# Patient Record
Sex: Male | Born: 1990 | Race: White | Hispanic: No | Marital: Married | Smoking: Never smoker
Health system: Southern US, Community
[De-identification: ages and names within clinical notes are randomized; demographics above are authoritative.]

## PROBLEM LIST (undated history)

## (undated) DIAGNOSIS — K219 Gastro-esophageal reflux disease without esophagitis: Secondary | ICD-10-CM

## (undated) DIAGNOSIS — F419 Anxiety disorder, unspecified: Secondary | ICD-10-CM

## (undated) DIAGNOSIS — R142 Eructation: Secondary | ICD-10-CM

## (undated) DIAGNOSIS — R1310 Dysphagia, unspecified: Secondary | ICD-10-CM

## (undated) HISTORY — PX: SHOULDER ARTHROSCOPY: SHX128

## (undated) HISTORY — DX: Anxiety disorder, unspecified: F41.9

## (undated) HISTORY — DX: Eructation: R14.2

## (undated) HISTORY — DX: Dysphagia, unspecified: R13.10

## (undated) HISTORY — PX: COLONOSCOPY: SHX174

## (undated) HISTORY — PX: UPPER GASTROINTESTINAL ENDOSCOPY: SHX188

## (undated) HISTORY — DX: Gastro-esophageal reflux disease without esophagitis: K21.9

---
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
## 2014-08-22 HISTORY — PX: WISDOM TOOTH EXTRACTION: SHX21

## 2016-08-30 ENCOUNTER — Encounter (HOSPITAL_COMMUNITY): Payer: Self-pay | Admitting: Emergency Medicine

## 2016-08-30 ENCOUNTER — Emergency Department (HOSPITAL_COMMUNITY)
Admission: EM | Admit: 2016-08-30 | Discharge: 2016-08-30 | Disposition: A | Discharge status: Home / Self Care | Payer: BLUE CROSS/BLUE SHIELD | Attending: Emergency Medicine | Admitting: Emergency Medicine

## 2016-08-30 DIAGNOSIS — Z79899 Other long term (current) drug therapy: Secondary | ICD-10-CM | POA: Insufficient documentation

## 2016-08-30 DIAGNOSIS — R197 Diarrhea, unspecified: Secondary | ICD-10-CM | POA: Insufficient documentation

## 2016-08-30 DIAGNOSIS — R112 Nausea with vomiting, unspecified: Secondary | ICD-10-CM | POA: Diagnosis not present

## 2016-08-30 MED ORDER — ONDANSETRON 8 MG PO TBDP
ORAL_TABLET | ORAL | Status: AC
  Filled 2016-08-30: qty 1

## 2016-08-30 MED ORDER — ONDANSETRON 4 MG PO TBDP
4.0000 mg | ORAL_TABLET | Freq: Once | ORAL | Status: AC
  Administered 2016-08-30: 4 mg via ORAL
  Filled 2016-08-30: qty 1

## 2016-08-30 MED ORDER — ONDANSETRON 8 MG PO TBDP
8.0000 mg | ORAL_TABLET | Freq: Once | ORAL | Status: AC
  Administered 2016-08-30: 8 mg via ORAL

## 2016-08-30 MED ORDER — SODIUM CHLORIDE 0.9 % IV BOLUS (SEPSIS)
1000.0000 mL | Freq: Once | INTRAVENOUS | Status: AC
  Administered 2016-08-30: 1000 mL via INTRAVENOUS

## 2016-08-30 MED ORDER — METOCLOPRAMIDE HCL 5 MG/ML IJ SOLN
10.0000 mg | Freq: Once | INTRAMUSCULAR | Status: AC
  Administered 2016-08-30: 10 mg via INTRAVENOUS
  Filled 2016-08-30: qty 2

## 2016-08-30 MED ORDER — ONDANSETRON 4 MG PO TBDP: 4.0000 mg | ORAL_TABLET | Freq: Three times a day (TID) | ORAL | 0 refills | Status: DC | PRN

## 2016-08-30 NOTE — ED Provider Notes (Signed)
[facility]-EMERGENCY DEPT Provider Note   CSN: [id number] Arrival date & time: 08/30/16  1731     History   Chief Complaint Chief Complaint  Patient presents with  . Emesis  . Diarrhea    HPI Marguarite ArbourCasey T Tripp is a 26 y.o. male.  HPI  The patient is a 26 year old male, he hasa past medical history but does report that he has been exposed to a couple of family members have been vomiting for the last couple of days. He reports the last bnight, started vomiting and has had both vomiting and watery diarrhea, multiple episodes including over 15 episodes of watery nonbloody stools he denies any abdominal pain but does feel like he has a low-grade fever. he denies any abdominal pain at this time.  The symptoms are persistent, moderate, nothing makes this better or worse    History reviewed. No pertinent past medical history.  There are no active problems to display for this patient.   History reviewed. No pertinent surgical history.     Home Medications    Prior to Admission medications   Medication Sig Start Date End Date Taking? Authorizing Provider  fexofenadine (ALLEGRA) 180 MG tablet Take 180 mg by mouth daily.   Yes Historical Provider, MD  fluticasone (FLONASE) 50 MCG/ACT nasal spray Place into the nose.   Yes Historical Provider, MD  ondansetron (ZOFRAN ODT) 4 MG disintegrating tablet Take 1 tablet (4 mg total) by mouth every 8 (eight) hours as needed for nausea. 08/30/16   Eber HongBrian Kaileia Flow, MD    Family History History reviewed. No pertinent family history.  Social History Social History  Substance Use Topics  . Smoking status: Never Smoker  . Smokeless tobacco: Current User    Types: Chew  . Alcohol use Yes     Comment: occasionally     Allergies   Penicillins   Review of Systems Review of Systems  All other systems reviewed and are negative.    Physical Exam Updated Vital Signs BP 120/61 (BP Location: Right Arm)   Pulse 62   Temp 98 F (36.7 C) (Oral)    Resp 16   Ht 5\' 11"  (1.803 m)   Wt 185 lb (83.9 kg)   SpO2 98%   BMI 25.80 kg/m   Physical Exam  Constitutional: He appears well-developed and well-nourished. No distress.  HENT:  Head: Normocephalic and atraumatic.  Mouth/Throat: Oropharynx is clear and moist. No oropharyngeal exudate.  Eyes: Conjunctivae and EOM are normal. Pupils are equal, round, and reactive to light. Right eye exhibits no discharge. Left eye exhibits no discharge. No scleral icterus.  Neck: Normal range of motion. Neck supple. No JVD present. No thyromegaly present.  Cardiovascular: Regular rhythm, normal heart sounds and intact distal pulses.  Exam reveals no gallop and no friction rub.   No murmur heard. Heart rate of 105  Pulmonary/Chest: Effort normal and breath sounds normal. No respiratory distress. He has no wheezes. He has no rales.  Abdominal: Soft. Bowel sounds are normal. He exhibits no distension and no mass. There is no tenderness.  Nontender abdomen  Musculoskeletal: Normal range of motion. He exhibits no edema or tenderness.  Lymphadenopathy:    He has no cervical adenopathy.  Neurological: He is alert. Coordination normal.  Skin: Skin is warm and dry. No rash noted. No erythema.  Psychiatric: He has a normal mood and affect. His behavior is normal.  Nursing note and vitals reviewed.    ED Treatments / Results  Labs (all labs  ordered are listed, but only abnormal results are displayed) Labs Reviewed - No data to display  Radiology No results found.  Procedures Procedures (including critical care time)  Medications Ordered in ED Medications  ondansetron (ZOFRAN-ODT) disintegrating tablet 8 mg (8 mg Oral Given 08/30/16 1746)  ondansetron (ZOFRAN-ODT) disintegrating tablet 4 mg (4 mg Oral Given 08/30/16 1937)  sodium chloride 0.9 % bolus 1,000 mL (0 mLs Intravenous Stopped 08/30/16 2105)  metoCLOPramide (REGLAN) injection 10 mg (10 mg Intravenous Given 08/30/16 1955)  sodium chloride 0.9 %  bolus 1,000 mL (1,000 mLs Intravenous New Bag/Given 08/30/16 2114)     Initial Impression / Assessment and Plan / ED Course  I have reviewed the triage vital signs and the nursing notes.  Pertinent labs & imaging results that were available during my care of the patient were reviewed by me and considered in my medical decision making (see chart for details).  Clinical Course     The patient appears dehydrated, he will need IV fluids as well as Zofran, he does not want any other workup and I agree to this time he does not need anything else.   Labs and fluids given, pt tolerated well and is doing very well, Stable for d/c.  Final Clinical Impressions(s) / ED Diagnoses   Final diagnoses:  Nausea and vomiting, intractability of vomiting not specified, unspecified vomiting type    New Prescriptions New Prescriptions   ONDANSETRON (ZOFRAN ODT) 4 MG DISINTEGRATING TABLET    Take 1 tablet (4 mg total) by mouth every 8 (eight) hours as needed for nausea.     Eber Hong, MD 08/30/16 2154

## 2016-08-30 NOTE — ED Triage Notes (Signed)
Patient complaining of generalized body aches, vomiting, and diarrhea since yesterday.

## 2016-08-30 NOTE — ED Notes (Signed)
Pt has consumed 2 cups of water and tolerated well

## 2016-08-30 NOTE — Discharge Instructions (Signed)
Zofran for nausea Drink plenty of fluids Stay away from other people as you are likely contagious

## 2017-05-03 ENCOUNTER — Encounter: Payer: Self-pay | Admitting: Internal Medicine

## 2017-06-26 ENCOUNTER — Encounter (INDEPENDENT_AMBULATORY_CARE_PROVIDER_SITE_OTHER): Payer: Self-pay

## 2017-06-26 ENCOUNTER — Ambulatory Visit (INDEPENDENT_AMBULATORY_CARE_PROVIDER_SITE_OTHER): Payer: BLUE CROSS/BLUE SHIELD | Admitting: Internal Medicine

## 2017-06-26 ENCOUNTER — Encounter: Payer: Self-pay | Admitting: Internal Medicine

## 2017-06-26 ENCOUNTER — Other Ambulatory Visit (INDEPENDENT_AMBULATORY_CARE_PROVIDER_SITE_OTHER): Payer: BLUE CROSS/BLUE SHIELD

## 2017-06-26 VITALS — BP 112/72 | HR 68 | Ht 71.0 in | Wt 204.0 lb

## 2017-06-26 DIAGNOSIS — R197 Diarrhea, unspecified: Secondary | ICD-10-CM

## 2017-06-26 DIAGNOSIS — R1084 Generalized abdominal pain: Secondary | ICD-10-CM

## 2017-06-26 DIAGNOSIS — R1310 Dysphagia, unspecified: Secondary | ICD-10-CM

## 2017-06-26 LAB — IGA: IGA: 298 mg/dL (ref 68–378)

## 2017-06-26 MED ORDER — NA SULFATE-K SULFATE-MG SULF 17.5-3.13-1.6 GM/177ML PO SOLN: 1.0000 | Freq: Once | ORAL | 0 refills | Status: AC

## 2017-06-26 NOTE — Patient Instructions (Signed)
Your physician has requested that you go to the basement for the following lab work before leaving today:  TTG, IGA  You have been scheduled for an endoscopy and colonoscopy. Please follow the written instructions given to you at your visit today. Please pick up your prep supplies at the pharmacy within the next 1-3 days. If you use inhalers (even only as needed), please bring them with you on the day of your procedure. Your physician has requested that you go to www.startemmi.com and enter the access code given to you at your visit today. This web site gives a general overview about your procedure. However, you should still follow specific instructions given to you by our office regarding your preparation for the procedure.   

## 2017-06-26 NOTE — Progress Notes (Signed)
HISTORY OF PRESENT ILLNESS:  Kristopher ArbourCasey T Haack is a 26 y.o. male who is referred by Dr. Maryelizabeth RowanElizabeth Dewey regarding problems with abdominal pain, diarrhea, and dysphagia. He is accompanied by his wife. First, patient reports problems with intermittent solid food and pill dysphagia for his long as he can remember. He was seen by an ENT specialist and told that he has acid reflux. He did undergo laryngoscopy but no x-rays. He was prescribed omeprazole but could not swallow the pill. His difficulties continue. Next, he has at least a one-year history of problems with abdominal cramping, urgency, and loose stools. However, symptoms have been much more prominent over the past 8 months or so. Typically has multiple loose bowel movements per day. He describes a minimum of 5. He does occasionally see blood on the tissue and occasionally has some rectal discomfort. Occasional formed stools are normal colored and sink. He has had no weight loss. He will have occasional nocturnal symptoms. Review of outside stool studies were negative for enteric pathogens and ova and parasites. Negative for white blood cells. Negative for Clostridium difficile. Blood work including CBC and comprehensive metabolic panel and TSH were normal. Hemoglobin 15.3. Normal MCV. Normal albumin at 4.4. He was seen in the emergency room in January. That encounter has been reviewed. At that time he presented with vomiting and watery diarrhea ALT will episodes. Apparently there had been family illness.  REVIEW OF SYSTEMS:  All non-GI ROS negative entirely upon comprehensive review  History reviewed. No pertinent past medical history.  Past Surgical History:  Procedure Laterality Date  . WISDOM TOOTH EXTRACTION  2016    Social History Kristopher Hutchinson  reports that  has never smoked. His smokeless tobacco use includes chew. He reports that he drinks alcohol. He reports that he does not use drugs.  family history includes Heart disease in his maternal  grandfather and paternal grandfather.  Allergies  Allergen Reactions  . Penicillins Anaphylaxis       PHYSICAL EXAMINATION: Vital signs: BP 112/72   Pulse 68   Ht 5\' 11"  (1.803 m)   Wt 204 lb (92.5 kg)   BMI 28.45 kg/m   Constitutional: generally well-appearing, no acute distress Psychiatric: alert and oriented x3, cooperative Eyes: extraocular movements intact, anicteric, conjunctiva pink Mouth: oral pharynx moist, no lesions Neck: supple no lymphadenopathy Cardiovascular: heart regular rate and rhythm, no murmur Lungs: clear to auscultation bilaterally Abdomen: soft, nontender, nondistended, no obvious ascites, no peritoneal signs, normal bowel sounds, no organomegaly Rectal: Deferred until colonoscopy Extremities: no clubbing, cyanosis, or lower extremity edema bilaterally Skin: no lesions on visible extremities Neuro: No focal deficits. Cranial nerves intact  ASSESSMENT:  #1. Severe intermittent solid food and pill dysphagia chronically. 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 Problems with cramping abdominal pain urgency and diarrhea as described. Worse this year. Acute diarrheal illness earlier this year. 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 Rule out celiac. Rule out microscopic colitis.    PLAN:  #1. Schedule upper endoscopy with possible esophageal dilation.The nature of the procedure, as well as the risks, benefits, and alternatives were carefully and thoroughly reviewed with the patient. Ample time for discussion and questions allowed. The patient understood, was satisfied, and agreed to proceed. #2. Schedule colonoscopy with biopsies.The nature of the procedure, as well as the risks, benefits, and alternatives were carefully and thoroughly reviewed with the patient. Ample time for discussion and questions allowed. The patient understood, was satisfied, and agreed to proceed. #  3. Celiac profile to include tissue  transaminase antibody IgA #4. Further recommendations to follow after the above completed.   A copy of this consultation note has been sent to Dr. Duanne Guess

## 2017-06-27 LAB — TISSUE TRANSGLUTAMINASE, IGA: (TTG) AB, IGA: 1 U/mL

## 2017-08-23 ENCOUNTER — Telehealth: Payer: Self-pay | Admitting: Internal Medicine

## 2017-08-23 MED ORDER — SUPREP BOWEL PREP KIT 17.5-3.13-1.6 GM/177ML PO SOLN: 1.0000 | ORAL | 0 refills | Status: DC

## 2017-08-23 NOTE — Telephone Encounter (Signed)
Suprep rx sent to pharmacy.

## 2017-09-04 ENCOUNTER — Other Ambulatory Visit: Payer: Self-pay

## 2017-09-04 ENCOUNTER — Emergency Department (HOSPITAL_COMMUNITY)
Admission: EM | Admit: 2017-09-04 | Discharge: 2017-09-05 | Disposition: A | Discharge status: Home / Self Care | Payer: BLUE CROSS/BLUE SHIELD | Attending: Emergency Medicine | Admitting: Emergency Medicine

## 2017-09-04 DIAGNOSIS — Z79899 Other long term (current) drug therapy: Secondary | ICD-10-CM | POA: Diagnosis not present

## 2017-09-04 DIAGNOSIS — K222 Esophageal obstruction: Secondary | ICD-10-CM | POA: Diagnosis present

## 2017-09-04 DIAGNOSIS — F17290 Nicotine dependence, other tobacco product, uncomplicated: Secondary | ICD-10-CM | POA: Insufficient documentation

## 2017-09-04 DIAGNOSIS — T18128A Food in esophagus causing other injury, initial encounter: Secondary | ICD-10-CM

## 2017-09-04 MED ORDER — GLUCAGON HCL RDNA (DIAGNOSTIC) 1 MG IJ SOLR
1.0000 mg | Freq: Once | INTRAMUSCULAR | Status: AC
  Administered 2017-09-04: 1 mg via INTRAVENOUS
  Filled 2017-09-04: qty 1

## 2017-09-04 MED ORDER — ONDANSETRON HCL 4 MG/2ML IJ SOLN
4.0000 mg | Freq: Once | INTRAMUSCULAR | Status: AC
  Administered 2017-09-04: 4 mg via INTRAVENOUS
  Filled 2017-09-04: qty 2

## 2017-09-04 MED ORDER — NITROGLYCERIN 0.4 MG SL SUBL
0.4000 mg | SUBLINGUAL_TABLET | Freq: Once | SUBLINGUAL | Status: AC
  Administered 2017-09-04: 0.4 mg via SUBLINGUAL
  Filled 2017-09-04: qty 1

## 2017-09-04 NOTE — ED Triage Notes (Signed)
Pt was eating dinner and got chicken stuck in his throat, feels like nothing can go down, pt in no distress and is able to talk, no SOB

## 2017-09-04 NOTE — Discharge Instructions (Signed)
Soft, non-chew diet until follow-up with Dr. Marina GoodellPerry.

## 2017-09-04 NOTE — ED Notes (Signed)
Pt able to swallow water at this time- Dr Fayrene FearingJames aware and at bedside to reevaluate pt

## 2017-09-05 ENCOUNTER — Telehealth: Payer: Self-pay | Admitting: Internal Medicine

## 2017-09-05 ENCOUNTER — Encounter: Payer: Self-pay | Admitting: Internal Medicine

## 2017-09-05 NOTE — Telephone Encounter (Signed)
[facility] Was seen in the ER last night due to choking on some chicken. Almost had emergency EGD but was able to get the chicken down and then drink water. Pts wife is concerned because today he is able to swallow liquids but reports it seems more difficult to do so. Concerned and want to know what they should do in the mean time until procedure. Please advise.

## 2017-09-05 NOTE — Telephone Encounter (Signed)
Pt's wife aware.

## 2017-09-05 NOTE — ED Provider Notes (Signed)
[facility] EMERGENCY DEPARTMENT Provider Note   CSN: [id number] Arrival date & time: 09/04/17  2117     History   Chief Complaint Chief Complaint  Patient presents with  . food bolus    chicken stuck in throat    HPI Kristopher Hutchinson is a 27 y.o. male.  Complaint is "food stuck"  HPI 27 year old male with no previous formal diagnosis of esophageal disorders.  However, previous episodes of a feeling of food impactions that have resolved with emesis or spontaneously.  At 70 tonight he was eating chicken he felt like it suddenly became lodged in his upper esophagus.  He points to an area just above the sternal notch.  He is spitting his secretions and anxious and presents here.  No past medical history on file.  There are no active problems to display for this patient.   Past Surgical History:  Procedure Laterality Date  . WISDOM TOOTH EXTRACTION  2016       Home Medications    Prior to Admission medications   Medication Sig Start Date End Date Taking? Authorizing Provider  fexofenadine (ALLEGRA) 180 MG tablet Take 180 mg by mouth daily.   Yes [provider]  fluticasone (FLONASE) 50 MCG/ACT nasal spray Place into the nose.   Yes [provider]  SUPREP BOWEL PREP KIT 17.5-3.13-1.6 GM/177ML SOLN Take 1 kit by mouth as directed. 08/23/17  Yes Irene Shipper, MD    Family History Family History  Problem Relation Age of Onset  . Heart disease Maternal Grandfather   . Heart disease Paternal Grandfather     Social History Social History   Tobacco Use  . Smoking status: Never Smoker  . Smokeless tobacco: Current User    Types: Chew  Substance Use Topics  . Alcohol use: Yes    Comment: occasionally  . Drug use: No     Allergies   Penicillins   Review of Systems Review of Systems  Constitutional: Negative for appetite change, chills, diaphoresis, fatigue and fever.  HENT: Positive for drooling. Negative for mouth sores, sore throat and  trouble swallowing.   Eyes: Negative for visual disturbance.  Respiratory: Positive for choking. Negative for cough, chest tightness, shortness of breath and wheezing.   Cardiovascular: Negative for chest pain.  Gastrointestinal: Negative for abdominal distention, abdominal pain, diarrhea, nausea and vomiting.  Endocrine: Negative for polydipsia, polyphagia and polyuria.  Genitourinary: Negative for dysuria, frequency and hematuria.  Musculoskeletal: Negative for gait problem.  Skin: Negative for color change, pallor and rash.  Neurological: Negative for dizziness, syncope, light-headedness and headaches.  Hematological: Does not bruise/bleed easily.  Psychiatric/Behavioral: Negative for behavioral problems and confusion.     Physical Exam Updated Vital Signs BP (!) 129/91   Pulse 81   Temp 98.5 F (36.9 C) (Oral)   Resp 17   Ht _0  (1.778 m)   Wt 86.2 kg (190 lb)   SpO2 97%   BMI 27.26 kg/m   Physical Exam  Constitutional: He is oriented to person, place, and time. He appears well-developed and well-nourished. No distress.  HENT:  Head: Normocephalic.  Pleasant.  Anxious.  Spitting secretions.  No difficulty breathing.  Well oxygenated  Eyes: Conjunctivae are normal. Pupils are equal, round, and reactive to light. No scleral icterus.  Neck: Normal range of motion. Neck supple. No thyromegaly present.  Cardiovascular: Normal rate and regular rhythm. Exam reveals no gallop and no friction rub.  No murmur heard. Pulmonary/Chest: Effort normal and breath  sounds normal. No respiratory distress. He has no wheezes. He has no rales.  Abdominal: Soft. Bowel sounds are normal. He exhibits no distension. There is no tenderness. There is no rebound.  Musculoskeletal: Normal range of motion.  Neurological: He is alert and oriented to person, place, and time.  Skin: Skin is warm and dry. No rash noted.  Psychiatric: He has a normal mood and affect. His behavior is normal.     ED  Treatments / Results  Labs (all labs ordered are listed, but only abnormal results are displayed) Labs Reviewed - No data to display  EKG  EKG Interpretation None       Radiology No results found.  Procedures Procedures (including critical care time)  Medications Ordered in ED Medications  ondansetron (ZOFRAN) injection 4 mg (4 mg Intravenous Given 09/04/17 2249)  glucagon (human recombinant) (GLUCAGEN) injection 1 mg (1 mg Intravenous Given 09/04/17 2250)  nitroGLYCERIN (NITROSTAT) SL tablet 0.4 mg (0.4 mg Sublingual Given 09/04/17 2249)     Initial Impression / Assessment and Plan / ED Course  I have reviewed the triage vital signs and the nursing notes.  Pertinent labs & imaging results that were available during my care of the patient were reviewed by me and considered in my medical decision making (see chart for details).     IV placed.  Given glucagon 1 mg IV, Zofran 4 mg IV.  Given 1 sublingual nitroglycerin.  After 30 minutes he had no improvement.  I placed a call to Dr. Buford Dresser.  Dr. Elta Guadeloupe was mobilizing the endoscopy team for planned extraction.  However, just after this the patient called out and had resolution.  Is able to watch him drink 2 full glasses of water without difficulty or regurgitation or drooling.  He is discharged home.  He has a follow-up appointment with Dr. Henrene Pastor, gastroneurologist in [place].  Plan is soft, no chew diet until follow-up with Dr. Henrene Pastor.  Final Clinical Impressions(s) / ED Diagnoses   Final diagnoses:  Esophageal obstruction due to food impaction    ED Discharge Orders    None       Tanna Furry, MD 09/05/17 [phone]

## 2017-09-05 NOTE — Telephone Encounter (Signed)
Stay away from meats until his procedures are completed. Only soft foods. Chew very well. Small portions at a time. Keep endoscopy appointment as scheduled

## 2017-09-11 ENCOUNTER — Telehealth: Payer: Self-pay | Admitting: Internal Medicine

## 2017-09-11 NOTE — Telephone Encounter (Signed)
Not a good idea as he will be receiving sedation that day. This will take care of his anxiety

## 2017-09-11 NOTE — Telephone Encounter (Signed)
Left message for pt to call back.  Wife states that pt is very nervous and anxious about upcoming procedures, he has never been sedated and is having lots of anxiety. Asking if there is anything pt can take that morning prior to the procedure to help with this. Please advise.

## 2017-09-11 NOTE — Telephone Encounter (Signed)
Spoke with pts wife and she is aware. 

## 2017-09-13 ENCOUNTER — Ambulatory Visit (AMBULATORY_SURGERY_CENTER): Payer: BLUE CROSS/BLUE SHIELD | Admitting: Internal Medicine

## 2017-09-13 ENCOUNTER — Other Ambulatory Visit: Payer: Self-pay

## 2017-09-13 ENCOUNTER — Encounter: Payer: Self-pay | Admitting: Internal Medicine

## 2017-09-13 ENCOUNTER — Telehealth: Payer: Self-pay | Admitting: Internal Medicine

## 2017-09-13 VITALS — BP 124/93 | HR 67 | Temp 98.0°F | Resp 12 | Ht 71.0 in | Wt 204.0 lb

## 2017-09-13 DIAGNOSIS — R197 Diarrhea, unspecified: Secondary | ICD-10-CM

## 2017-09-13 DIAGNOSIS — K222 Esophageal obstruction: Secondary | ICD-10-CM

## 2017-09-13 DIAGNOSIS — R1084 Generalized abdominal pain: Secondary | ICD-10-CM | POA: Diagnosis not present

## 2017-09-13 DIAGNOSIS — R1310 Dysphagia, unspecified: Secondary | ICD-10-CM

## 2017-09-13 MED ORDER — SODIUM CHLORIDE 0.9 % IV SOLN: 500.0000 mL | Freq: Once | INTRAVENOUS | Status: DC

## 2017-09-13 MED ORDER — HYDROCODONE-ACETAMINOPHEN 5-325 MG PO TABS: 1.0000 | ORAL_TABLET | ORAL | 0 refills | Status: DC | PRN

## 2017-09-13 NOTE — Telephone Encounter (Signed)
Reexamined the esophagus post EGD dilation. Superficial trauma in several areas. Pain expected. Looked good postprocedure wide-awake. Modify diet and provide Vicodin for symptomatic relief. [facility]. Thank you

## 2017-09-13 NOTE — Telephone Encounter (Signed)
Per Dr. Lamar SprinklesPerry's order, an Rx for Vicodin 5/325 #10 was printed and left at 3rd floor front desk for patient's wife to pick up.  Spoke with patient's wife and she will come pick up today.  Advised patient to stay on clear liquids and progress to soft diet as tolerated.  They will call back if symptoms worsen.

## 2017-09-13 NOTE — Telephone Encounter (Signed)
Patient wife calling back regarding this stating that patient is in severe pain.

## 2017-09-13 NOTE — Telephone Encounter (Signed)
Returned patient's call and spoke with his wife.  She states that patient is having sharp pain on the right side of his throat when drinking liquids.  He is able to get sips down but it is very painful.  She is concerned about him getting dehydrated.  Spoke with Dr. Marina GoodellPerry.  He will call patient to speak with them about their concerns.

## 2017-09-13 NOTE — Progress Notes (Signed)
PT C/O 8/10 CENTER CHEST DISCOMFORT . Dr Marina Goodellperry made aware.

## 2017-09-13 NOTE — Progress Notes (Signed)
To recovery, report to RN, VSS. 

## 2017-09-13 NOTE — Progress Notes (Signed)
Pt's states no medical or surgical changes since previsit or office visit. 

## 2017-09-13 NOTE — Patient Instructions (Addendum)
   FOLLOW DILATATION DIET GIVEN TO YOU TODAY    YOU HAD AN ENDOSCOPIC PROCEDURE TODAY AT THE [facility]:   Refer to the procedure report that was given to you for any specific questions about what was found during the examination.  If the procedure report does not answer your questions, please call your gastroenterologist to clarify.  If you requested that your care partner not be given the details of your procedure findings, then the procedure report has been included in a sealed envelope for you to review at your convenience later.  YOU SHOULD EXPECT: Some feelings of bloating in the abdomen. Passage of more gas than usual.  Walking can help get rid of the air that was put into your GI tract during the procedure and reduce the bloating. If you had a lower endoscopy (such as a colonoscopy or flexible sigmoidoscopy) you may notice spotting of blood in your stool or on the toilet paper. If you underwent a bowel prep for your procedure, you may not have a normal bowel movement for a few days.  Please Note:  You might notice some irritation and congestion in your nose or some drainage.  This is from the oxygen used during your procedure.  There is no need for concern and it should clear up in a day or so.  SYMPTOMS TO REPORT IMMEDIATELY:   Following lower endoscopy (colonoscopy or flexible sigmoidoscopy):  Excessive amounts of blood in the stool  Significant tenderness or worsening of abdominal pains  Swelling of the abdomen that is new, acute  Fever of 100F or higher   Following upper endoscopy (EGD)  Vomiting of blood or coffee ground material  New chest pain or pain under the shoulder blades  Painful or persistently difficult swallowing  New shortness of breath  Fever of 100F or higher  Black, tarry-looking stools  For urgent or emergent issues, a gastroenterologist can be reached at any hour by calling [phone].   DIET:  FOLLOW DILATATION DIET GIVEN TO YOU  TODAY   ACTIVITY:  You should plan to take it easy for the rest of today and you should NOT DRIVE or use heavy machinery until tomorrow (because of the sedation medicines used during the test).    FOLLOW UP: Our staff will call the number listed on your records the next business day following your procedure to check on you and address any questions or concerns that you may have regarding the information given to you following your procedure. If we do not reach you, we will leave a message.  However, if you are feeling well and you are not experiencing any problems, there is no need to return our call.  We will assume that you have returned to your regular daily activities without incident.  If any biopsies were taken you will be contacted by phone or by letter within the next 1-3 weeks.  Please call us at [phone] if you have not heard about the biopsies in 3 weeks.    SIGNATURES/CONFIDENTIALITY: You and/or your care partner have signed paperwork which will be entered into your electronic medical record.  These signatures attest to the fact that that the information above on your After Visit Summary has been reviewed and is understood.  Full responsibility of the confidentiality of this discharge information lies with you and/or your care-partner.

## 2017-09-13 NOTE — Op Note (Signed)
[facility] Patient Name: Kristopher Hutchinson Procedure Date: 09/13/2017 11:00 AM MRN: [id number] Endoscopist: Wilhemina Bonito. Marina Goodell , MD Age: 27 Referring MD:  Date of Birth: 02/20/1991 Gender: Male Account #: [id number] Procedure:                Upper GI endoscopy, with [facility] dilation of the                            esophagus-70F Indications:              Dysphagia. Recurrent transient food impactions Medicines:                Monitored Anesthesia Care Procedure:                Pre-Anesthesia Assessment:                           - Prior to the procedure, a History and Physical                            was performed, and patient medications and                            allergies were reviewed. The patient's tolerance of                            previous anesthesia was also reviewed. The risks                            and benefits of the procedure and the sedation                            options and risks were discussed with the patient.                            All questions were answered, and informed consent                            was obtained. Prior Anticoagulants: The patient has                            taken no previous anticoagulant or antiplatelet                            agents. ASA Grade Assessment: I - A normal, healthy                            patient. After reviewing the risks and benefits,                            the patient was deemed in satisfactory condition to                            undergo the procedure.  After obtaining informed consent, the endoscope was                            passed under direct vision. Throughout the                            procedure, the patient's blood pressure, pulse, and                            oxygen saturations were monitored continuously. The                            Model GIF-HQ190 [id number]) scope was introduced                            through the mouth, and  advanced to the second part                            of duodenum. The upper GI endoscopy was                            accomplished without difficulty. The patient                            tolerated the procedure well. Scope In: Scope Out: Findings:                 Multiple moderate benign-appearing, intrinsic                            stenoses -rings and furrows. See images. The                            narrowest stenosis measured 1 cm (inner diameter).                            Several were disrupted with passage of the                            endoscope. The scope was withdrawn after the                            endoscopic survey was completed. Dilation was                            performed with a Maloney dilator with minimal                            resistance at 46 Fr. The dilation site was examined                            following endoscope reinsertion and showed classic                            linear lacerations  associated with ring disruption.                            There was definite moderate improvement in luminal                            narrowing.                           The stomach was normal.                           The examined duodenum was normal.                           The cardia and gastric fundus were normal on                            retroflexion. Complications:            No immediate complications. Estimated Blood Loss:     Estimated blood loss: none. Impression:               - Multiple Benign-appearing esophageal stenoses.                            EOE appearance. Dilated.                           - Normal stomach.                           - Normal examined duodenum.                           - No specimens collected. Recommendation:           - Patient has a contact number available for                            emergencies. The signs and symptoms of potential                            delayed complications were  discussed with the                            patient. Return to normal activities tomorrow.                            Written discharge instructions were provided to the                            patient.                           - Post-dilation diet. Continue to chew food well.                           - Prescribe omeprazole 40  mg; #60; one by mouth                            twice a day; 11 refills                           - Schedule repeat EGD with dilation in the [facility] in                            4-6 weeks. Wilhemina BonitoJohn N. Marina GoodellPerry, MD 09/13/2017 11:39:15 AM This report has been signed electronically.

## 2017-09-13 NOTE — Progress Notes (Signed)
Called to room to assist during endoscopic procedure.  Patient ID and intended procedure confirmed with present staff. Received instructions for my participation in the procedure from the performing physician.  

## 2017-09-13 NOTE — Op Note (Signed)
[facility] Patient Name: Kristopher Hutchinson Procedure Date: 09/13/2017 11:00 AM MRN: [id number] Endoscopist: Wilhemina Bonito. Marina Goodell , MD Age: 27 Referring MD:  Date of Birth: 11-28-1990 Gender: Male Account #: [id number] Procedure:                Colonoscopy, with biopsies Indications:              Abdominal pain, Chronic diarrhea Medicines:                Monitored Anesthesia Care Procedure:                Pre-Anesthesia Assessment:                           - Prior to the procedure, a History and Physical                            was performed, and patient medications and                            allergies were reviewed. The patient's tolerance of                            previous anesthesia was also reviewed. The risks                            and benefits of the procedure and the sedation                            options and risks were discussed with the patient.                            All questions were answered, and informed consent                            was obtained. Prior Anticoagulants: The patient has                            taken no previous anticoagulant or antiplatelet                            agents. ASA Grade Assessment: I - A normal, healthy                            patient. After reviewing the risks and benefits,                            the patient was deemed in satisfactory condition to                            undergo the procedure.                           After obtaining informed consent, the colonoscope  was passed under direct vision. Throughout the                            procedure, the patient's blood pressure, pulse, and                            oxygen saturations were monitored continuously. The                            Colonoscope was introduced through the anus and                            advanced to the the cecum, identified by                            appendiceal orifice and ileocecal  valve. The                            terminal ileum, ileocecal valve, appendiceal                            orifice, and rectum were photographed. The quality                            of the bowel preparation was excellent. The                            colonoscopy was performed without difficulty. The                            patient tolerated the procedure well. The bowel                            preparation used was SUPREP. Scope In: 11:10:05 AM Scope Out: 11:19:38 AM Scope Withdrawal Time: 0 hours 8 minutes 1 second  Total Procedure Duration: 0 hours 9 minutes 33 seconds  Findings:                 The terminal ileum appeared normal for 15 cm.                           The entire examined colon appeared normal on direct                            and retroflexion views. Biopsies for histology were                            taken with a cold forceps from the entire colon for                            evaluation of microscopic colitis. Complications:            No immediate complications. Estimated blood loss:  None. Estimated Blood Loss:     Estimated blood loss: none. Impression:               - The examined portion of the ileum was normal.                           - The entire examined colon is normal on direct and                            retroflexion views. Recommendation:           - EGD today. Please see report.                           - Patient has a contact number available for                            emergencies. The signs and symptoms of potential                            delayed complications were discussed with the                            patient. Return to normal activities tomorrow.                            Written discharge instructions were provided to the                            patient.                           - Resume previous diet.                           - Continue present medications.                            - Await pathology results. Wilhemina Bonito. Marina Goodell, MD 09/13/2017 11:23:40 AM This report has been signed electronically.

## 2017-09-13 NOTE — Telephone Encounter (Addendum)
Returned call from pts wife at 1415pm.  Pt is complaining of shooting pain and rates it 8 or 9 out of 10. He has only been able to take a few sips d/t the pain. His pain is mid chest and is radiating to his right arm.  Dr. Marina GoodellPerry please advise.

## 2017-09-14 ENCOUNTER — Telehealth: Payer: Self-pay | Admitting: *Deleted

## 2017-09-14 ENCOUNTER — Other Ambulatory Visit: Payer: Self-pay | Admitting: *Deleted

## 2017-09-14 ENCOUNTER — Telehealth: Payer: Self-pay

## 2017-09-14 DIAGNOSIS — R1310 Dysphagia, unspecified: Secondary | ICD-10-CM

## 2017-09-14 MED ORDER — OMEPRAZOLE 40 MG PO CPDR: 40.0000 mg | DELAYED_RELEASE_CAPSULE | Freq: Two times a day (BID) | ORAL | 1 refills | Status: DC

## 2017-09-14 MED ORDER — OMEPRAZOLE 40 MG PO CPDR: 40.0000 mg | DELAYED_RELEASE_CAPSULE | Freq: Two times a day (BID) | ORAL | 11 refills | Status: DC

## 2017-09-14 NOTE — Telephone Encounter (Signed)
  Follow up Call-  Call back number 09/13/2017  Post procedure Call Back phone  # [phone]  Permission to leave phone message Yes  Some recent data might be hidden     Patient questions:  Do you have a fever, pain , or abdominal swelling? Yes.   Pain Score  5 *  Have you tolerated food without any problems? No.  Have you been able to return to your normal activities? No.  Do you have any questions about your discharge instructions: Diet   No. Medications  No. Follow up visit  No.  Do you have questions or concerns about your Care? No.  Actions: * If pain score is 4 or above: No action needed, pain <4.  Patient is still experiencing pain when he drinks per wife. Dr. Marina GoodellPerry said he would be really sore from the dilation. Told wife to call back when he wakes up if it is still really sore when he drinks.

## 2017-09-14 NOTE — Telephone Encounter (Signed)
Omeprazole sent into pharmacy per D.O. And EGD and PV appts made for pt

## 2017-09-14 NOTE — Telephone Encounter (Signed)
I spoke with patient's wife yesterday afternoon. He is experiencing pain with swallowing. No chest pain. No pain with inspiration or breathing difficulty. Uncomfortable but in no acute distress. Expected based on diagnosis, treatment, and post-dilation endoscopic findings. They have picked up the prescription for analgesics. Directions given regarding advancement of diet and pain management. Endoscopy nurse to follow-up in a.m. Contact us in the interim for worrisome signs or symptoms should they occur. She was appreciative

## 2017-09-14 NOTE — Telephone Encounter (Signed)
Called pt's wife TurkeyVictoria back @ [phone] left message that Omeprazole RX was sent in to [facility] in [place] and gave her dates and times for EGD scheduled for 10/13/17 and a previsit scheduled 2/15 19.

## 2017-09-15 ENCOUNTER — Telehealth: Payer: Self-pay

## 2017-09-15 NOTE — Telephone Encounter (Signed)
Called Patients wife, Kristopher Hutchinson, and followed up to make sure she received Pennys message on 09/14/17. She did get the message about his medication and his 2 appointments. She also said that he was able to eat something yesterday and that the soreness is improving in his throat.

## 2017-09-18 ENCOUNTER — Telehealth: Payer: Self-pay | Admitting: Internal Medicine

## 2017-09-18 ENCOUNTER — Other Ambulatory Visit: Payer: Self-pay

## 2017-09-18 DIAGNOSIS — R1013 Epigastric pain: Secondary | ICD-10-CM

## 2017-09-18 NOTE — Telephone Encounter (Signed)
Spoke with pts wife and she is aware. Pt scheduled for gast[facility]rafin swallow at [facility] 09/19/17@8 :30am, pt to arrive there at 8:15am. Pt to be NPO after midnight. Wife aware of appt.

## 2017-09-18 NOTE — Telephone Encounter (Signed)
If he is gradually improving, then I think it is okay. However, since there are ongoing complaints and concerns set him up for a Gastrografin swallow "esophageal dilation last week, chest pain with eating ". Thanks

## 2017-09-18 NOTE — Telephone Encounter (Signed)
Left message for pt to call back  °

## 2017-09-18 NOTE — Telephone Encounter (Signed)
Pts wife states that the pt is still c/o pain in his chest when he swallows any food or liquid. States it feels like a knife in the right side of his chest right under his right breast. Wife concerned that he is still having discomfort and wants to make sure this is to be expected and nothing to worry about. Please advise.

## 2017-09-19 ENCOUNTER — Encounter: Payer: Self-pay | Admitting: Internal Medicine

## 2017-09-19 ENCOUNTER — Ambulatory Visit (HOSPITAL_COMMUNITY)
Admission: RE | Admit: 2017-09-19 | Discharge: 2017-09-19 | Disposition: A | Discharge status: Home / Self Care | Payer: BLUE CROSS/BLUE SHIELD | Source: Ambulatory Visit | Attending: Internal Medicine | Admitting: Internal Medicine

## 2017-09-19 ENCOUNTER — Ambulatory Visit (HOSPITAL_COMMUNITY): Payer: BLUE CROSS/BLUE SHIELD

## 2017-09-19 DIAGNOSIS — K222 Esophageal obstruction: Secondary | ICD-10-CM | POA: Insufficient documentation

## 2017-09-19 DIAGNOSIS — R0789 Other chest pain: Secondary | ICD-10-CM | POA: Insufficient documentation

## 2017-09-19 DIAGNOSIS — R1013 Epigastric pain: Secondary | ICD-10-CM | POA: Insufficient documentation

## 2017-09-19 MED ORDER — IOPAMIDOL (ISOVUE-300) INJECTION 61%
INTRAVENOUS | Status: AC
  Administered 2017-09-19: 50 mL via ORAL
  Filled 2017-09-19: qty 150

## 2017-09-19 MED ORDER — IOPAMIDOL (ISOVUE-300) INJECTION 61%
150.0000 mL | Freq: Once | INTRAVENOUS | Status: AC | PRN
  Administered 2017-09-19: 50 mL via ORAL

## 2017-10-06 ENCOUNTER — Ambulatory Visit (AMBULATORY_SURGERY_CENTER): Payer: Self-pay

## 2017-10-06 VITALS — Ht 71.0 in | Wt 204.6 lb

## 2017-10-06 DIAGNOSIS — R1310 Dysphagia, unspecified: Secondary | ICD-10-CM

## 2017-10-06 NOTE — Progress Notes (Signed)
Per pt, no allergies to soy or egg products.Pt not taking any weight loss meds or using  O2 at home.  Pt refused emmi video. 

## 2017-10-13 ENCOUNTER — Ambulatory Visit (AMBULATORY_SURGERY_CENTER): Payer: BLUE CROSS/BLUE SHIELD | Admitting: Internal Medicine

## 2017-10-13 ENCOUNTER — Encounter: Payer: Self-pay | Admitting: Internal Medicine

## 2017-10-13 ENCOUNTER — Other Ambulatory Visit: Payer: Self-pay

## 2017-10-13 VITALS — BP 129/86 | HR 66 | Temp 98.7°F | Resp 11 | Ht 71.0 in | Wt 204.0 lb

## 2017-10-13 DIAGNOSIS — R1310 Dysphagia, unspecified: Secondary | ICD-10-CM

## 2017-10-13 DIAGNOSIS — K222 Esophageal obstruction: Secondary | ICD-10-CM

## 2017-10-13 MED ORDER — HYDROCODONE-ACETAMINOPHEN 5-325 MG PO TABS: ORAL_TABLET | ORAL | 0 refills | Status: DC

## 2017-10-13 MED ORDER — SODIUM CHLORIDE 0.9 % IV SOLN: 500.0000 mL | Freq: Once | INTRAVENOUS | Status: DC

## 2017-10-13 NOTE — Patient Instructions (Signed)
Post dilatation  diet given  YOU HAD AN ENDOSCOPIC PROCEDURE TODAY AT THE [facility]:   Refer to the procedure report that was given to you for any specific questions about what was found during the examination.  If the procedure report does not answer your questions, please call your gastroenterologist to clarify.  If you requested that your care partner not be given the details of your procedure findings, then the procedure report has been included in a sealed envelope for you to review at your convenience later.  YOU SHOULD EXPECT: Some feelings of bloating in the abdomen. Passage of more gas than usual.  Walking can help get rid of the air that was put into your GI tract during the procedure and reduce the bloating. If you had a lower endoscopy (such as a colonoscopy or flexible sigmoidoscopy) you may notice spotting of blood in your stool or on the toilet paper. If you underwent a bowel prep for your procedure, you may not have a normal bowel movement for a few days.  Please Note:  You might notice some irritation and congestion in your nose or some drainage.  This is from the oxygen used during your procedure.  There is no need for concern and it should clear up in a day or so.  SYMPTOMS TO REPORT IMMEDIATELY:     Following upper endoscopy (EGD)  Vomiting of blood or coffee ground material  New chest pain or pain under the shoulder blades  Painful or persistently difficult swallowing  New shortness of breath  Fever of 100F or higher  Black, tarry-looking stools  For urgent or emergent issues, a gastroenterologist can be reached at any hour by calling [phone].   DIET:  We do recommend a small meal at first, but then you may proceed to your regular diet.  Drink plenty of fluids but you should avoid alcoholic beverages for 24 hours.  ACTIVITY:  You should plan to take it easy for the rest of today and you should NOT DRIVE or use heavy machinery until tomorrow  (because of the sedation medicines used during the test).    FOLLOW UP: Our staff will call the number listed on your records the next business day following your procedure to check on you and address any questions or concerns that you may have regarding the information given to you following your procedure. If we do not reach you, we will leave a message.  However, if you are feeling well and you are not experiencing any problems, there is no need to return our call.  We will assume that you have returned to your regular daily activities without incident.  If any biopsies were taken you will be contacted by phone or by letter within the next 1-3 weeks.  Please call us at [phone] if you have not heard about the biopsies in 3 weeks.    SIGNATURES/CONFIDENTIALITY: You and/or your care partner have signed paperwork which will be entered into your electronic medical record.  These signatures attest to the fact that that the information above on your After Visit Summary has been reviewed and is understood.  Full responsibility of the confidentiality of this discharge information lies with you and/or your care-partner.

## 2017-10-13 NOTE — Progress Notes (Signed)
To recovery, report to RN, VSS. 

## 2017-10-13 NOTE — Op Note (Signed)
[facility] Patient Name: Kristopher Hutchinson Procedure Date: 10/13/2017 10:21 AM MRN: [id number] Endoscopist: Wilhemina Bonito. Marina Goodell , MD Age: 27 Referring MD:  Date of Birth: 13-Apr-1991 Gender: Male Account #: [id number] Procedure:                Upper GI endoscopy, With [facility] dilation?"58F Indications:              Dysphagia Medicines:                Monitored Anesthesia Care Procedure:                Pre-Anesthesia Assessment:                           - Prior to the procedure, a History and Physical                            was performed, and patient medications and                            allergies were reviewed. The patient's tolerance of                            previous anesthesia was also reviewed. The risks                            and benefits of the procedure and the sedation                            options and risks were discussed with the patient.                            All questions were answered, and informed consent                            was obtained. Prior Anticoagulants: The patient has                            taken no previous anticoagulant or antiplatelet                            agents. ASA Grade Assessment: I - A normal, healthy                            patient. After reviewing the risks and benefits,                            the patient was deemed in satisfactory condition to                            undergo the procedure.                           After obtaining informed consent, the endoscope was  passed under direct vision. Throughout the                            procedure, the patient's blood pressure, pulse, and                            oxygen saturations were monitored continuously. The                            Model GIF-HQ190 [id number]) scope was introduced                            through the mouth, and advanced to the second part                            of duodenum. The upper GI  endoscopy was                            accomplished without difficulty. The patient                            tolerated the procedure well. Scope In: Scope Out: Findings:                 Multiple moderate benign-appearing, intrinsic                            stenoses were found Throughout the esophagus. The                            narrowest stenosis measured 1.2 cm (inner                            diameter). The scope was withdrawn After completing                            the endoscopic survey. Dilation was performed with                            a Maloney dilator with mild resistance at 46 Fr.                            The dilation site was examined following endoscope                            reinsertion and showed moderate improvement in                            luminal narrowing With superficial trauma as                            manifested by linear lacerations in 2 separate                            locations.  The stomach was normal.                           The examined duodenum was normal.                           The cardia and gastric fundus were normal on                            retroflexion. Complications:            No immediate complications. Estimated Blood Loss:     Estimated blood loss: none. Impression:               - Benign-appearing esophageal stenoses. EOE type                            esophagus. Dilated.                           - Normal stomach.                           - Normal examined duodenum.                           - No specimens collected. Recommendation:           1. Post dilation diet                           2. Continue omeprazole 40 mg twice daily                           3. Prescribe Vicodin 5/325 mg; #20; one to 2 by                            mouth every 4-6 hours as needed for severe pain                           4. Repeat upper endoscopy with dilation in                             approximately 4 weeks. Wilhemina BonitoJohn N. Marina GoodellPerry, MD 10/13/2017 10:44:53 AM This report has been signed electronically.

## 2017-10-13 NOTE — Progress Notes (Signed)
Pt's states no medical or surgical changes since previsit or office visit. 

## 2017-10-13 NOTE — Progress Notes (Signed)
Called to room to assist during endoscopic procedure.  Patient ID and intended procedure confirmed with present staff. Received instructions for my participation in the procedure from the performing physician.  

## 2017-10-16 ENCOUNTER — Telehealth: Payer: Self-pay | Admitting: *Deleted

## 2017-10-16 ENCOUNTER — Telehealth: Payer: Self-pay

## 2017-10-16 NOTE — Telephone Encounter (Signed)
  Follow up Call-  Call back number 10/13/2017 09/13/2017  Post procedure Call Back phone  # [phone]  Permission to leave phone message Yes Yes  Some recent data might be hidden     No answer, unable to leave message due to "mail box is full".

## 2017-10-16 NOTE — Telephone Encounter (Signed)
  Follow up Call-  Call back number 10/13/2017 09/13/2017  Post procedure Call Back phone  # [phone]  Permission to leave phone message Yes Yes  Some recent data might be hidden     Patient questions:  Do you have a fever, pain , or abdominal swelling? No. Pain Score  0 *  Have you tolerated food without any problems? Yes.    Have you been able to return to your normal activities? Yes.    Do you have any questions about your discharge instructions: Diet   No. Medications  No. Follow up visit  No.  Do you have questions or concerns about your Care? No.  Actions: * If pain score is 4 or above: No action needed, pain <4.

## 2017-10-31 ENCOUNTER — Telehealth: Payer: Self-pay | Admitting: Internal Medicine

## 2017-10-31 NOTE — Telephone Encounter (Signed)
Spoke with pts wife and she is aware. 

## 2017-10-31 NOTE — Telephone Encounter (Signed)
Continue bid ppi. Add ranitidine 300 mg at bed time. OK to use tums or rolaids as well for breakthrough symptoms

## 2017-10-31 NOTE — Telephone Encounter (Signed)
Left message to call back.  Pts wife states for about a week the pt has been c/o heartburn symptoms and that he has a sensation of a "lump" in his throat. He is taking the omeprazole 40mg  BID and wife states his diet has not changed. Wife calling to see if he may need a different PPI or an additional med. Please advise.

## 2017-11-14 ENCOUNTER — Other Ambulatory Visit: Payer: Self-pay

## 2017-11-14 ENCOUNTER — Ambulatory Visit (AMBULATORY_SURGERY_CENTER): Payer: Self-pay

## 2017-11-14 VITALS — Ht 71.0 in | Wt 202.0 lb

## 2017-11-14 DIAGNOSIS — R1310 Dysphagia, unspecified: Secondary | ICD-10-CM

## 2017-11-14 NOTE — Progress Notes (Signed)
Denies allergies to eggs or soy products. Denies complication of anesthesia or sedation. Denies use of weight loss medication. Denies use of O2.   Emmi instructions declined.  

## 2017-11-21 ENCOUNTER — Encounter: Payer: Self-pay | Admitting: Internal Medicine

## 2017-11-27 ENCOUNTER — Other Ambulatory Visit: Payer: Self-pay

## 2017-11-27 ENCOUNTER — Ambulatory Visit (AMBULATORY_SURGERY_CENTER): Payer: BLUE CROSS/BLUE SHIELD | Admitting: Internal Medicine

## 2017-11-27 ENCOUNTER — Encounter: Payer: Self-pay | Admitting: Internal Medicine

## 2017-11-27 VITALS — BP 122/57 | HR 74 | Temp 98.4°F | Resp 11 | Ht 71.0 in | Wt 202.0 lb

## 2017-11-27 DIAGNOSIS — R1310 Dysphagia, unspecified: Secondary | ICD-10-CM | POA: Diagnosis present

## 2017-11-27 DIAGNOSIS — K222 Esophageal obstruction: Secondary | ICD-10-CM | POA: Diagnosis not present

## 2017-11-27 MED ORDER — SODIUM CHLORIDE 0.9 % IV SOLN: 500.0000 mL | Freq: Once | INTRAVENOUS | Status: DC

## 2017-11-27 NOTE — Patient Instructions (Signed)
*   Handout given on stricture and dilation diet. * Make appt to see Dr Marina GoodellPerry in the office in 6 months   YOU HAD AN ENDOSCOPIC PROCEDURE TODAY AT THE [facility]:   Refer to the procedure report that was given to you for any specific questions about what was found during the examination.  If the procedure report does not answer your questions, please call your gastroenterologist to clarify.  If you requested that your care partner not be given the details of your procedure findings, then the procedure report has been included in a sealed envelope for you to review at your convenience later.  YOU SHOULD EXPECT: Some feelings of bloating in the abdomen. Passage of more gas than usual.  Walking can help get rid of the air that was put into your GI tract during the procedure and reduce the bloating. If you had a lower endoscopy (such as a colonoscopy or flexible sigmoidoscopy) you may notice spotting of blood in your stool or on the toilet paper. If you underwent a bowel prep for your procedure, you may not have a normal bowel movement for a few days.  Please Note:  You might notice some irritation and congestion in your nose or some drainage.  This is from the oxygen used during your procedure.  There is no need for concern and it should clear up in a day or so.  SYMPTOMS TO REPORT IMMEDIATELY:   Following lower endoscopy (colonoscopy or flexible sigmoidoscopy):  Excessive amounts of blood in the stool  Significant tenderness or worsening of abdominal pains  Swelling of the abdomen that is new, acute  Fever of 100F or higher  For urgent or emergent issues, a gastroenterologist can be reached at any hour by calling [phone].   DIET:  We do recommend a small meal at first, but then you may proceed to your regular diet.  Drink plenty of fluids but you should avoid alcoholic beverages for 24 hours.  ACTIVITY:  You should plan to take it easy for the rest of today and you should NOT  DRIVE or use heavy machinery until tomorrow (because of the sedation medicines used during the test).    FOLLOW UP: Our staff will call the number listed on your records the next business day following your procedure to check on you and address any questions or concerns that you may have regarding the information given to you following your procedure. If we do not reach you, we will leave a message.  However, if you are feeling well and you are not experiencing any problems, there is no need to return our call.  We will assume that you have returned to your regular daily activities without incident.  If any biopsies were taken you will be contacted by phone or by letter within the next 1-3 weeks.  Please call us at [phone] if you have not heard about the biopsies in 3 weeks.    SIGNATURES/CONFIDENTIALITY: You and/or your care partner have signed paperwork which will be entered into your electronic medical record.  These signatures attest to the fact that that the information above on your After Visit Summary has been reviewed and is understood.  Full responsibility of the confidentiality of this discharge information lies with you and/or your care-partner.

## 2017-11-27 NOTE — Progress Notes (Signed)
Report to PACU, RN, vss, BBS= Clear.  

## 2017-11-27 NOTE — Progress Notes (Signed)
Called to room to assist during endoscopic procedure.  Patient ID and intended procedure confirmed with present staff. Received instructions for my participation in the procedure from the performing physician.  

## 2017-11-27 NOTE — Op Note (Signed)
[facility] Patient Name: Kristopher Hutchinson Procedure Date: 11/27/2017 10:09 AM MRN: [id number] Endoscopist: Wilhemina Bonito. Marina Goodell , MD Age: 27 Referring MD:  Date of Birth: December 12, 1990 Gender: Male Account #: [id number] Procedure:                Upper GI endoscopy, with [facility] dilation 72F, 65F Indications:              Dysphagia, Therapeutic procedure. Previous                            esophageal dilation 09/13/2017 and 10/13/2017 Medicines:                Monitored Anesthesia Care Procedure:                Pre-Anesthesia Assessment:                           - Prior to the procedure, a History and Physical                            was performed, and patient medications and                            allergies were reviewed. The patient's tolerance of                            previous anesthesia was also reviewed. The risks                            and benefits of the procedure and the sedation                            options and risks were discussed with the patient.                            All questions were answered, and informed consent                            was obtained. Prior Anticoagulants: The patient has                            taken no previous anticoagulant or antiplatelet                            agents. ASA Grade Assessment: I - A normal, healthy                            patient. After reviewing the risks and benefits,                            the patient was deemed in satisfactory condition to                            undergo the procedure.  After obtaining informed consent, the endoscope was                            passed under direct vision. Throughout the                            procedure, the patient's blood pressure, pulse, and                            oxygen saturations were monitored continuously. The                            Endoscope was introduced through the mouth, and   advanced to the second part of duodenum. The upper                            GI endoscopy was accomplished without difficulty.                            The patient tolerated the procedure well. Scope In: Scope Out: Findings:                 Multiple benign-appearing, intrinsic moderate                            stenoses were found. The overall appearance of the                            esophagus was improved. The narrowest stenosis                            measured 1.4 cm (inner diameter). The scope was                            withdrawn. Dilation was performed with a Maloney                            dilator with no resistance at 48 then 50 Fr. Relook                            endoscopy after each dilation revealed minimal                            superficial trauma proximally.                           The stomach was normal.                           The examined duodenum was normal.                           The cardia and gastric fundus were normal on  retroflexion. Complications:            No immediate complications. Estimated Blood Loss:     Estimated blood loss: none. Impression:               - Benign-appearing esophageal stenoses. Dilated.                           - Normal stomach.                           - Normal examined duodenum.                           - No specimens collected. Recommendation:           - Office follow-up 6 months with Dr. Elizabelle Fite. Contact                Marina Goodell            the office in the interim should swallowing                            difficulties significantly recur.                           - Post dilation diet.                           - Continue present medications. Wilhemina BonitoJohn N. Marina GoodellPerry, MD 11/27/2017 10:28:43 AM This report has been signed electronically.

## 2017-11-27 NOTE — Progress Notes (Signed)
Pt's states no medical or surgical changes since previsit or office visit. 

## 2017-11-28 ENCOUNTER — Telehealth: Payer: Self-pay | Admitting: *Deleted

## 2017-11-28 NOTE — Telephone Encounter (Signed)
  Follow up Call-  Call back number 11/27/2017 10/13/2017 09/13/2017  Post procedure Call Back phone  # [phone]  Permission to leave phone message Yes Yes Yes  Some recent data might be hidden     Patient questions:  Do you have a fever, pain , or abdominal swelling? No. Pain Score  0 *  Have you tolerated food without any problems? Yes.    Have you been able to return to your normal activities? Yes  Do you have any questions about your discharge instructions: Diet   No. Medications  No. Follow up visit  No.  Do you have questions or concerns about your Care? No.  Actions: * If pain score is 4 or above: No action needed, pain <4.

## 2017-11-28 NOTE — Telephone Encounter (Signed)
No answer. Left message to call if questions or concerns. 

## 2018-03-23 ENCOUNTER — Telehealth: Payer: Self-pay | Admitting: Internal Medicine

## 2018-03-23 NOTE — Telephone Encounter (Signed)
Prior auth request has been placed through [email or web address]. Patient has Dx: GERD with esophageal stenosis  Has tried and failed once daily omeprazole in the past.

## 2018-03-23 NOTE — Telephone Encounter (Signed)
Pharmacy called stating medication omeprazole needs prior auth for approval.

## 2018-03-30 ENCOUNTER — Telehealth: Payer: Self-pay

## 2018-03-30 NOTE — Telephone Encounter (Signed)
Twice a day Omeprazole approved by [employer] through 03/24/19

## 2018-06-13 ENCOUNTER — Ambulatory Visit: Payer: BLUE CROSS/BLUE SHIELD | Admitting: Internal Medicine

## 2018-06-19 ENCOUNTER — Encounter: Payer: Self-pay | Admitting: Internal Medicine

## 2018-06-19 ENCOUNTER — Ambulatory Visit (INDEPENDENT_AMBULATORY_CARE_PROVIDER_SITE_OTHER): Payer: BLUE CROSS/BLUE SHIELD | Admitting: Internal Medicine

## 2018-06-19 VITALS — BP 116/70 | HR 96 | Ht 69.0 in | Wt 210.2 lb

## 2018-06-19 DIAGNOSIS — K6289 Other specified diseases of anus and rectum: Secondary | ICD-10-CM

## 2018-06-19 DIAGNOSIS — K222 Esophageal obstruction: Secondary | ICD-10-CM | POA: Diagnosis not present

## 2018-06-19 DIAGNOSIS — R1013 Epigastric pain: Secondary | ICD-10-CM

## 2018-06-19 DIAGNOSIS — R1310 Dysphagia, unspecified: Secondary | ICD-10-CM

## 2018-06-19 DIAGNOSIS — R197 Diarrhea, unspecified: Secondary | ICD-10-CM | POA: Diagnosis not present

## 2018-06-19 MED ORDER — OMEPRAZOLE 40 MG PO CPDR: 40.0000 mg | DELAYED_RELEASE_CAPSULE | Freq: Two times a day (BID) | ORAL | 11 refills | Status: DC

## 2018-06-19 NOTE — Progress Notes (Signed)
HISTORY OF PRESENT ILLNESS:  Kristopher Hutchinson is a 27 y.o. male who was initially evaluated June 26, 2017 regarding intermittent solid food dysphasia and diarrhea.  See that dictation.  Complete colonoscopy with biopsies was performed September 13, 2017.  Normal terminal ileum.  Normal colonic mucosa.  Normal random colon biopsies.  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  Patient continues with loose stools with urgency, particularly in the morning.  No incontinence.  He cannot identify any particular foods that exacerbate symptoms.  Lactose-free has not helped.  He also reports some rectal discomfort and a protruding area.  Describes sandpaperlike effect when walking.  He has undergone several upper endoscopies with esophageal dilation.  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  Previous dilations January 2019, February 2019, and April 2019 to a maximal diameter of 50 [place].  He takes omeprazole 40 mg twice daily.  Have been using H2 receptor antagonist as well to help relieve indigestion and heartburn.  No longer taking ranitidine due to recall.  Does continue on omeprazole 40 mg twice daily.  His swallowing was doing very well until the past 1 to 2 months when he is noticed some recurrent symptoms with solid food lodging.  Also trouble with pills.  He is having some reflux symptoms off his H2 receptor antagonist.  He has multiple appropriate questions  REVIEW OF SYSTEMS:  All non-GI ROS negative unless otherwise stated in the HPI except for back pain and muscle cramps  Past Medical History:  Diagnosis Date  . Anxiety   . Belching   . Dysphagia    had endo in 09/13/17  . GERD (gastroesophageal reflux disease)     Past Surgical History:  Procedure Laterality Date  . UPPER GASTROINTESTINAL ENDOSCOPY    . WISDOM TOOTH EXTRACTION  2016    Social History Kristopher Hutchinson  reports that he has never smoked. His smokeless tobacco use includes snuff. He  reports that he drinks alcohol. He reports that he does not use drugs.  family history includes Heart disease in his maternal grandfather and paternal grandfather; Hypertension in his father and mother.  Allergies  Allergen Reactions  . Penicillins Anaphylaxis       PHYSICAL EXAMINATION: Vital signs: BP 116/70 (BP Location: Left Arm, Patient Position: Sitting, Cuff Size: Normal)   Pulse 96   Ht 5\' 9"  (1.753 m) Comment: height measured without shoes  Wt 210 lb 4 oz (95.4 kg)   BMI 31.05 kg/m   Constitutional: generally well-appearing, no acute distress Psychiatric: alert and oriented x3, cooperative Eyes: extraocular movements intact, anicteric, conjunctiva pink Mouth: oral pharynx moist, no lesions Neck: supple no lymphadenopathy Cardiovascular: heart regular rate and rhythm, no murmur Lungs: clear to auscultation bilaterally Abdomen: soft, nontender, nondistended, no obvious ascites, no peritoneal signs, normal bowel sounds, no organomegaly Rectal: Omitted Extremities: no clubbing, cyanosis, or lower extremity edema bilaterally Skin: no lesions on visible extremities Neuro: No focal deficits.  Cranial nerves intact  ASSESSMENT:  1.  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  Multiple prior esophageal dilations for dysphasia.  Breakthrough reflux symptoms on current regimen 2.  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  Postinfectious irritable bowel syndrome.  Ongoing   PLAN:  1.  Reflux precautions 2.  Continue omeprazole 40 mg twice daily 3.  Initiate Pepcid 20 mg once or twice daily as needed 4.  Schedule upper endoscopy with esophageal dilation.The nature of the procedure, as well as the risks, benefits, and  alternatives were carefully and thoroughly reviewed with the patient. Ample time for discussion and questions allowed. The patient understood, was satisfied, and agreed to proceed. 5.  Metamucil 2 tablespoons daily 6.  IBgard once or twice daily 7.   Follow-up after the above to be determined.

## 2018-06-19 NOTE — Patient Instructions (Signed)
You have been scheduled for an endoscopy. Please follow written instructions given to you at your visit today. If you use inhalers (even only as needed), please bring them with you on the day of your procedure. Your physician has requested that you go to www.startemmi.com and enter the access code given to you at your visit today. This web site gives a general overview about your procedure. However, you should still follow specific instructions given to you by our office regarding your preparation for the procedure.  We have sent the following medications to your pharmacy for you to pick up at your convenience: Prilosec  You may purchase IBgard over the counter - take twice a day  Take 2 tablespoons of Metamucil daily in water or juice  Switch from Zantac to Pepcid 20mg  twice a day

## 2018-06-26 ENCOUNTER — Encounter: Payer: Self-pay | Admitting: Internal Medicine

## 2018-06-26 ENCOUNTER — Ambulatory Visit (AMBULATORY_SURGERY_CENTER): Payer: BLUE CROSS/BLUE SHIELD | Admitting: Internal Medicine

## 2018-06-26 VITALS — BP 121/75 | HR 70 | Temp 97.5°F | Resp 13 | Ht 69.0 in | Wt 210.0 lb

## 2018-06-26 DIAGNOSIS — R1310 Dysphagia, unspecified: Secondary | ICD-10-CM | POA: Diagnosis not present

## 2018-06-26 DIAGNOSIS — K222 Esophageal obstruction: Secondary | ICD-10-CM | POA: Diagnosis present

## 2018-06-26 DIAGNOSIS — K2100 Gastro-esophageal reflux disease with esophagitis, without bleeding: Secondary | ICD-10-CM

## 2018-06-26 MED ORDER — SODIUM CHLORIDE 0.9 % IV SOLN: 500.0000 mL | Freq: Once | INTRAVENOUS | Status: DC

## 2018-06-26 NOTE — Op Note (Signed)
[facility] Patient Name: Kristopher Hutchinson Procedure Date: 06/26/2018 9:05 AM MRN: [id number] Endoscopist: Wilhemina Bonito. Marina Goodell , MD Age: 27 Referring MD:  Date of Birth: February 18, 1991 Gender: Male Account #: [id number] Procedure:                Upper GI endoscopy with Kristopher Hutchinson dilation of the                            esophagus 50 French, 52 [place] Indications:              Dysphagia, Therapeutic procedure Medicines:                Monitored Anesthesia Care Procedure:                Pre-Anesthesia Assessment:                           - Prior to the procedure, a History and Physical                            was performed, and patient medications and                            allergies were reviewed. The patient's tolerance of                            previous anesthesia was also reviewed. The risks                            and benefits of the procedure and the sedation                            options and risks were discussed with the patient.                            All questions were answered, and informed consent                            was obtained. Prior Anticoagulants: The patient has                            taken no previous anticoagulant or antiplatelet                            agents. ASA Grade Assessment: I - A normal, healthy                            patient. After reviewing the risks and benefits,                            the patient was deemed in satisfactory condition to                            undergo the procedure.  After obtaining informed consent, the endoscope was                            passed under direct vision. Throughout the                            procedure, the patient's blood pressure, pulse, and                            oxygen saturations were monitored continuously. The                            Endoscope was introduced through the mouth, and                            advanced to the second part of  duodenum. The upper                            GI endoscopy was accomplished without difficulty.                            The patient tolerated the procedure well. Scope In: Scope Out: Findings:                 Multiple benign-appearing, intrinsic moderate                            stenoses were found. These were large caliber for                            the most part compared to previous exams. The scope                            was withdrawn. Dilation was performed with a                            Maloney dilator with no resistance at 50 Fr and 52                            [place]. The dilation site was examined following                            endoscope reinsertion and showed mild mucosal                            disruption in the most proximal esophagus only.                           The exam of the esophagus was otherwise normal.                           The stomach was normal.  The examined duodenum was normal.                           The cardia and gastric fundus were normal on                            retroflexion. Complications:            No immediate complications. Estimated Blood Loss:     Estimated blood loss: none. Impression:               - Benign-appearing esophageal stenoses. Dilated.                           - Normal stomach.                           - Normal examined duodenum.                           - No specimens collected. Recommendation:           - Patient has a contact number available for                            emergencies. The signs and symptoms of potential                            delayed complications were discussed with the                            patient. Return to normal activities tomorrow.                            Written discharge instructions were provided to the                            patient.                           - Post dilation diet.                           - Continue  present medications.                           - Office follow-up with Dr. Marina Goodell in about 6 months Wilhemina Bonito. Marina Goodell, MD 06/26/2018 9:25:45 AM This report has been signed electronically.

## 2018-06-26 NOTE — Progress Notes (Signed)
Spontaneous respirations throughout. VSS. Resting comfortably. To PACU on room air. Report to  RN. 

## 2018-06-26 NOTE — Progress Notes (Signed)
Called to room to assist during endoscopic procedure.  Patient ID and intended procedure confirmed with present staff. Received instructions for my participation in the procedure from the performing physician.  

## 2018-06-26 NOTE — Patient Instructions (Signed)
Discharge instructions given. Handout on a dilatation diet. Resume previous medications. YOU HAD AN ENDOSCOPIC PROCEDURE TODAY AT THE [facility]:   Refer to the procedure report that was given to you for any specific questions about what was found during the examination.  If the procedure report does not answer your questions, please call your gastroenterologist to clarify.  If you requested that your care partner not be given the details of your procedure findings, then the procedure report has been included in a sealed envelope for you to review at your convenience later.  YOU SHOULD EXPECT: Some feelings of bloating in the abdomen. Passage of more gas than usual.  Walking can help get rid of the air that was put into your GI tract during the procedure and reduce the bloating. If you had a lower endoscopy (such as a colonoscopy or flexible sigmoidoscopy) you may notice spotting of blood in your stool or on the toilet paper. If you underwent a bowel prep for your procedure, you may not have a normal bowel movement for a few days.  Please Note:  You might notice some irritation and congestion in your nose or some drainage.  This is from the oxygen used during your procedure.  There is no need for concern and it should clear up in a day or so.  SYMPTOMS TO REPORT IMMEDIATELY:    Following upper endoscopy (EGD)  Vomiting of blood or coffee ground material  New chest pain or pain under the shoulder blades  Painful or persistently difficult swallowing  New shortness of breath  Fever of 100F or higher  Black, tarry-looking stools  For urgent or emergent issues, a gastroenterologist can be reached at any hour by calling [phone].   DIET:  We do recommend a small meal at first, but then you may proceed to your regular diet.  Drink plenty of fluids but you should avoid alcoholic beverages for 24 hours.  ACTIVITY:  You should plan to take it easy for the rest of today and you  should NOT DRIVE or use heavy machinery until tomorrow (because of the sedation medicines used during the test).    FOLLOW UP: Our staff will call the number listed on your records the next business day following your procedure to check on you and address any questions or concerns that you may have regarding the information given to you following your procedure. If we do not reach you, we will leave a message.  However, if you are feeling well and you are not experiencing any problems, there is no need to return our call.  We will assume that you have returned to your regular daily activities without incident.  If any biopsies were taken you will be contacted by phone or by letter within the next 1-3 weeks.  Please call us at [phone] if you have not heard about the biopsies in 3 weeks.    SIGNATURES/CONFIDENTIALITY: You and/or your care partner have signed paperwork which will be entered into your electronic medical record.  These signatures attest to the fact that that the information above on your After Visit Summary has been reviewed and is understood.  Full responsibility of the confidentiality of this discharge information lies with you and/or your care-partner. 

## 2018-06-27 ENCOUNTER — Telehealth: Payer: Self-pay | Admitting: *Deleted

## 2018-06-27 NOTE — Telephone Encounter (Signed)
  Follow up Call-  Call back number 06/26/2018 11/27/2017 10/13/2017 09/13/2017  Post procedure Call Back phone  # [phone]  Permission to leave phone message Yes Yes Yes Yes  Some recent data might be hidden    LMOM; told pt to call back if he has any questions or concerns

## 2018-06-27 NOTE — Telephone Encounter (Signed)
Left message on f/u call 

## 2018-12-26 ENCOUNTER — Encounter: Payer: Self-pay | Admitting: Internal Medicine

## 2019-03-20 ENCOUNTER — Emergency Department (HOSPITAL_COMMUNITY)
Admission: EM | Admit: 2019-03-20 | Discharge: 2019-03-20 | Disposition: A | Discharge status: Home / Self Care | Payer: BC Managed Care – PPO | Attending: Emergency Medicine | Admitting: Emergency Medicine

## 2019-03-20 ENCOUNTER — Other Ambulatory Visit: Payer: Self-pay

## 2019-03-20 ENCOUNTER — Emergency Department (HOSPITAL_COMMUNITY): Payer: BC Managed Care – PPO

## 2019-03-20 ENCOUNTER — Encounter (HOSPITAL_COMMUNITY): Payer: Self-pay | Admitting: Emergency Medicine

## 2019-03-20 DIAGNOSIS — R079 Chest pain, unspecified: Secondary | ICD-10-CM | POA: Diagnosis present

## 2019-03-20 DIAGNOSIS — R071 Chest pain on breathing: Secondary | ICD-10-CM | POA: Insufficient documentation

## 2019-03-20 DIAGNOSIS — F17228 Nicotine dependence, chewing tobacco, with other nicotine-induced disorders: Secondary | ICD-10-CM | POA: Insufficient documentation

## 2019-03-20 DIAGNOSIS — Z79899 Other long term (current) drug therapy: Secondary | ICD-10-CM | POA: Insufficient documentation

## 2019-03-20 LAB — CBC
HCT: 45.2 % (ref 39.0–52.0)
Hemoglobin: 15.6 g/dL (ref 13.0–17.0)
MCH: 28 pg (ref 26.0–34.0)
MCHC: 34.5 g/dL (ref 30.0–36.0)
MCV: 81.1 fL (ref 80.0–100.0)
Platelets: 264 K/uL (ref 150–400)
RBC: 5.57 MIL/uL (ref 4.22–5.81)
RDW: 12.4 % (ref 11.5–15.5)
WBC: 13 K/uL — ABNORMAL HIGH (ref 4.0–10.5)
nRBC: 0 % (ref 0.0–0.2)

## 2019-03-20 LAB — BASIC METABOLIC PANEL
Anion gap: 12 (ref 5–15)
BUN: 14 mg/dL (ref 6–20)
CO2: 23 mmol/L (ref 22–32)
Calcium: 9.5 mg/dL (ref 8.9–10.3)
Chloride: 105 mmol/L (ref 98–111)
Creatinine, Ser: 1.2 mg/dL (ref 0.61–1.24)
GFR calc Af Amer: >60 mL/min (ref >60)
GFR calc non Af Amer: >60 mL/min (ref >60)
Glucose, Bld: 107 mg/dL — ABNORMAL HIGH (ref 70–99)
Potassium: 3.8 mmol/L (ref 3.5–5.1)
Sodium: 140 mmol/L (ref 135–145)

## 2019-03-20 LAB — D-DIMER, QUANTITATIVE: D-Dimer, Quant: 0.35 ug/mL-FEU (ref 0.00–0.50)

## 2019-03-20 LAB — HEPATIC FUNCTION PANEL
ALT: 23 U/L (ref 0–44)
AST: 19 U/L (ref 15–41)
Albumin: 4.2 g/dL (ref 3.5–5.0)
Alkaline Phosphatase: 69 U/L (ref 38–126)
Bilirubin, Direct: 0.1 mg/dL (ref 0.0–0.2)
Indirect Bilirubin: 0.4 mg/dL (ref 0.3–0.9)
Total Bilirubin: 0.5 mg/dL (ref 0.3–1.2)
Total Protein: 7.1 g/dL (ref 6.5–8.1)

## 2019-03-20 LAB — TROPONIN I (HIGH SENSITIVITY)
Troponin I (High Sensitivity): <2 ng/L
Troponin I (High Sensitivity): <2 ng/L

## 2019-03-20 LAB — LIPASE, BLOOD: Lipase: 26 U/L (ref 11–51)

## 2019-03-20 MED ORDER — ALUM & MAG HYDROXIDE-SIMETH 200-200-20 MG/5ML PO SUSP
30.0000 mL | Freq: Once | ORAL | Status: AC
  Administered 2019-03-20: 30 mL via ORAL
  Filled 2019-03-20: qty 30

## 2019-03-20 MED ORDER — HYDROCODONE-ACETAMINOPHEN 5-325 MG PO TABS: 1.0000 | ORAL_TABLET | Freq: Four times a day (QID) | ORAL | 0 refills | Status: DC | PRN

## 2019-03-20 MED ORDER — SODIUM CHLORIDE 0.9% FLUSH
3.0000 mL | Freq: Once | INTRAVENOUS | Status: AC
  Administered 2019-03-20: 3 mL via INTRAVENOUS

## 2019-03-20 MED ORDER — LIDOCAINE VISCOUS HCL 2 % MT SOLN
15.0000 mL | Freq: Once | OROMUCOSAL | Status: AC
  Administered 2019-03-20: 15 mL via ORAL
  Filled 2019-03-20: qty 15

## 2019-03-20 MED ORDER — KETOROLAC TROMETHAMINE 30 MG/ML IJ SOLN
30.0000 mg | Freq: Once | INTRAMUSCULAR | Status: AC
  Administered 2019-03-20: 30 mg via INTRAVENOUS
  Filled 2019-03-20: qty 1

## 2019-03-20 NOTE — ED Provider Notes (Addendum)
[facility]ENC[facility] Provider Note   CSN: [id number] Arrival date & time: 03/20/19  1548     History   Chief Complaint Chief Complaint  Patient presents with  . Chest Pain    HPI Marguarite ArbourCasey T Heldt is a 28 y.o. male.     The history is provided by the patient and medical records. No language interpreter was used.  Chest Pain Associated symptoms: no palpitations    Marguarite ArbourCasey T Feagans is a 28 y.o. male  with a PMH of GERD, anxiety, dysphasia leading to esophageal dilation who presents to the Emergency Department complaining of right lower chest pain which began on Sunday.  Over the last few days, pain has been progressively getting worse.  It is worse with certain movements as well as when he takes a deep breath.  He denies any shortness of breath, cough, congestion or fever.  No inciting event or injury.  He has been taking his home omeprazole.  Tried some Gas-X as well which did not help much.  No abdominal pain, nausea, vomiting, diarrhea.  Denies history of similar.  Past Medical History:  Diagnosis Date  . Anxiety   . Belching   . Dysphagia    had endo in 09/13/17  . GERD (gastroesophageal reflux disease)     There are no active problems to display for this patient.   Past Surgical History:  Procedure Laterality Date  . COLONOSCOPY    . UPPER GASTROINTESTINAL ENDOSCOPY    . WISDOM TOOTH EXTRACTION  2016        Home Medications    Prior to Admission medications   Medication Sig Start Date End Date Taking? Authorizing Provider  fexofenadine (ALLEGRA) 180 MG tablet Take 180 mg by mouth daily.    [provider]  fluticasone (FLONASE) 50 MCG/ACT nasal spray Place into the nose as needed.     [provider]  HYDROcodone-acetaminophen (NORCO) 5-325 MG tablet Take 1 tablet by mouth every 6 (six) hours as needed for moderate pain. 03/20/19   Ward, Chase PicketJaime Pilcher, PA-C  omeprazole (PRILOSEC) 40 MG capsule Take 1 capsule (40 mg total)  by mouth 2 (two) times daily. Take 1 tablet 30 minutes before breakfast and 30 minutes before dinner 06/19/18   Hilarie FredricksonPerry, John N, MD    Family History Family History  Problem Relation Age of Onset  . Heart disease Maternal Grandfather   . Heart disease Paternal Grandfather   . Hypertension Mother   . Hypertension Father   . Colon cancer Neg Hx   . Esophageal cancer Neg Hx   . Liver cancer Neg Hx   . Pancreatic cancer Neg Hx   . Rectal cancer Neg Hx   . Stomach cancer Neg Hx     Social History Social History   Tobacco Use  . Smoking status: Never Smoker  . Smokeless tobacco: Current User    Types: Snuff  . Tobacco comment: dip chewing tabacco daily  Substance Use Topics  . Alcohol use: Yes    Comment: occasionally  . Drug use: No     Allergies   Penicillins   Review of Systems Review of Systems  Cardiovascular: Positive for chest pain. Negative for palpitations and leg swelling.  All other systems reviewed and are negative.    Physical Exam Updated Vital Signs BP (!) 133/93   Pulse 75   Temp 98.4 F (36.9 C) (Oral)   Resp 11   Ht 5\' 11"  (1.803 m)  Wt 90.7 kg   SpO2 98%   BMI 27.89 kg/m   Physical Exam Vitals signs and nursing note reviewed.  Constitutional:      General: He is not in acute distress.    Appearance: He is well-developed.  HENT:     Head: Normocephalic and atraumatic.  Neck:     Musculoskeletal: Neck supple.  Cardiovascular:     Rate and Rhythm: Normal rate and regular rhythm.     Heart sounds: Normal heart sounds. No murmur.  Pulmonary:     Effort: Pulmonary effort is normal. No respiratory distress.     Breath sounds: Normal breath sounds.     Comments: No chest wall tenderness. Abdominal:     General: There is no distension.     Palpations: Abdomen is soft.     Comments: No abdominal or flank tenderness.  Skin:    General: Skin is warm and dry.  Neurological:     Mental Status: He is alert and oriented to person, place, and  time.      ED Treatments / Results  Labs (all labs ordered are listed, but only abnormal results are displayed) Labs Reviewed  BASIC METABOLIC PANEL - Abnormal; Notable for the following components:      Result Value   Glucose, Bld 107 (*)    All other components within normal limits  CBC - Abnormal; Notable for the following components:   WBC 13.0 (*)    All other components within normal limits  HEPATIC FUNCTION PANEL  LIPASE, BLOOD  D-DIMER, QUANTITATIVE (NOT AT [facility])  TROPONIN I (HIGH SENSITIVITY)  TROPONIN I (HIGH SENSITIVITY)    EKG EKG Interpretation  Date/Time:  Wednesday March 20 2019 15:53:54 EDT Ventricular Rate:  98 PR Interval:  158 QRS Duration: 104 QT Interval:  346 QTC Calculation: 441 R Axis:   122 Text Interpretation:  Normal sinus rhythm Possible Left atrial enlargement Right axis deviation Incomplete right bundle branch block Abnormal ECG No acute changes No old tracing to compare Nonspecific ST and T wave abnormality Confirmed by Varney Biles [phone]) on 03/20/2019 6:31:14 PM   Radiology Dg Chest 2 View  Result Date: 03/20/2019 CLINICAL DATA:  Chest pain EXAM: CHEST - 2 VIEW COMPARISON:  None. FINDINGS: Lungs are clear. Heart size and pulmonary vascularity are normal. No adenopathy. No pneumothorax. No bone lesions. Note that there is thoracic lordosis. IMPRESSION: No edema or consolidation. Electronically Signed   By: Lowella Grip III M.D.   On: 03/20/2019 17:25    Procedures Procedures (including critical care time)  Medications Ordered in ED Medications  sodium chloride flush (NS) 0.9 % injection 3 mL (3 mLs Intravenous Given 03/20/19 1833)  alum & mag hydroxide-simeth (MAALOX/MYLANTA) 200-200-20 MG/5ML suspension 30 mL (30 mLs Oral Given 03/20/19 1916)    And  lidocaine (XYLOCAINE) 2 % viscous mouth solution 15 mL (15 mLs Oral Given 03/20/19 1916)  ketorolac (TORADOL) 30 MG/ML injection 30 mg (30 mg Intravenous Given 03/20/19 1917)      Initial Impression / Assessment and Plan / ED Course  I have reviewed the triage vital signs and the nursing notes.  Pertinent labs & imaging results that were available during my care of the patient were reviewed by me and considered in my medical decision making (see chart for details).        RIGO LETTS is a 28 y.o. male who presents to ED for right lower chest pain since Sunday. 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  He is tolerating PO fine in ED today. On exam, patient is afebrile, hemodynamically stable with no reproducible chest or abdominal tenderness. Labs reviewed and reassuring with negative troponin x2 and d-dimer.  CXR with no acute abnormalities.  EKG non-ischemic.   Low risk heart score.  Patient's symptoms unlikely to be of cardiac etiology. ? If this could be GI related. He is followed by GI and can call them tomorrow to arrange follow up.  Reasons to return to the emergency department were discussed with patient. Evaluation does not show pathology that would require ongoing emergent intervention or inpatient treatment.Patient understands return precautions and follow up plan. All questions answered.   Patient discussed with Dr. Rhunette CroftNanavati who agrees with treatment plan.    Final Clinical Impressions(s) / ED Diagnoses   Final diagnoses:  Chest pain with low risk for cardiac etiology    ED Discharge Orders         Ordered    HYDROcodone-acetaminophen (NORCO) 5-325 MG tablet  Every 6 hours PRN     03/20/19 1930            Ward, Chase PicketJaime Pilcher, PA-C 03/20/19 2021    Derwood KaplanNanavati, Ankit, MD 03/21/19 1435

## 2019-03-20 NOTE — ED Triage Notes (Signed)
Pt reports starting on Sunday he began to have right sided chest pain worse with deep breath. Pt denies any sob, fever or cough. denies any known injury.

## 2019-03-20 NOTE — Discharge Instructions (Signed)
It was my pleasure taking care of you today!   Take pain medication only as needed for pain.   Call your GI doctor in the morning to schedule a follow up appointment.   Return to ER for new or worsening symptoms, any additional concerns.

## 2019-03-20 NOTE — ED Notes (Signed)
Patient Alert and oriented to baseline. Stable and ambulatory to baseline. Patient verbalized understanding of the discharge instructions.  Patient belongings were taken by the patient.   

## 2019-03-21 ENCOUNTER — Telehealth: Payer: Self-pay | Admitting: Internal Medicine

## 2019-03-21 NOTE — Telephone Encounter (Signed)
Pts wife states that they told the ER that when he eats and drinks the pain swallowing makes his pain worse. She does not feel he has an impaction because he is able to drink. Reports this am when he took a pain pill along with some viscous lidocaine that she had on hand he screamed due to the pain. States the pain is similar to what he had prior to having the strictures stretched.

## 2019-03-21 NOTE — Telephone Encounter (Signed)
Spoke with pts wife and she is aware. 

## 2019-03-21 NOTE — Telephone Encounter (Signed)
I read his ER evaluation.  They said that his pain was increased by deep breath and certain movements.  This would not be GI.  His only source of GI chest pain would be due to food impaction.  Make sure he does not have food impaction.  If not, he should see his PCP for noncardiac/non-GI chest pain.  However he should continue on his PPI for his GERD.

## 2019-03-21 NOTE — Telephone Encounter (Signed)
Pts wife called and states pt was seen in the ER last night for right sided chest pain. Pt was given norco, gi cocktail and toradol in the ER. Starts pt is taking his omeprazole 40mg  BID. Wife states the ER did not find a cardiac reason for his discomfort and told them to follow-up with GI. First available appt with PA is 8/20 with an APP, wife states he cannot wait that long as he is still having the pain. Please advise.

## 2019-03-21 NOTE — Telephone Encounter (Signed)
Patient was seen at the ED yesterday and is still having pain. PT wife called and would like to have the pt seen soon. But we do not have anything ava until 8/20 with a PA and Dr. Henrene Pastor does not have anything ava. Patient wife would like a call to know if he can be seen sooner

## 2019-03-21 NOTE — Telephone Encounter (Signed)
Treatment is supportive care with altered diet (liquids and soft foods), viscous lidocaine, and time.  If he is not better by the first of the week, have them call me

## 2019-03-27 ENCOUNTER — Telehealth: Payer: Self-pay | Admitting: *Deleted

## 2019-03-27 NOTE — Telephone Encounter (Signed)
Copied from [place] [id number]. Topic: Appointment Scheduling - Scheduling Inquiry for Clinic >> Mar 20, 2019 10:46 AM Erick Blinks wrote: Reason for CRM: Pt's wife [place] called requesting a new pt appt with Dr. Ethlyn Gallery, pt has experienced chest pain under right breast for 3 days. Pt is currently at work (wanted to triage first) but wife wants to get him scheduled. Best contact: [phone] >> Mar 27, 2019  3:55 PM Francine Graven wrote: This patient was seen in the ER on 07/29 and it looks like they have a different PCP.

## 2019-03-28 NOTE — Telephone Encounter (Signed)
I left a message for the pt to return my call. 

## 2019-03-28 NOTE — Telephone Encounter (Signed)
I would be ok with accepting him, but I do not have openings to see him on Friday and am out of office next week. Looks like eval in ER was stable, but if needing to be seen sooner for follow up could ask [facility] to do ER follow up virtual visit OR he could follow up with current PCP and then transition later.

## 2019-04-04 NOTE — Telephone Encounter (Signed)
I left a message at the pts cell number to return my call. 

## 2019-04-23 ENCOUNTER — Telehealth: Payer: Self-pay | Admitting: Internal Medicine

## 2019-04-23 DIAGNOSIS — R1310 Dysphagia, unspecified: Secondary | ICD-10-CM

## 2019-04-24 NOTE — Telephone Encounter (Signed)
Prior authorization sent to CoverMyMeds.

## 2019-04-25 MED ORDER — OMEPRAZOLE 40 MG PO CPDR: 40.0000 mg | DELAYED_RELEASE_CAPSULE | Freq: Two times a day (BID) | ORAL | 3 refills | Status: DC

## 2019-04-25 NOTE — Telephone Encounter (Signed)
Prior authorization for Omeprazole approved.  Resent Rx to pharmacy

## 2019-07-30 ENCOUNTER — Telehealth: Payer: Self-pay | Admitting: Internal Medicine

## 2019-07-30 NOTE — Telephone Encounter (Signed)
Not an issue

## 2019-07-30 NOTE — Telephone Encounter (Signed)
Spoke with wife and she is aware

## 2019-07-30 NOTE — Telephone Encounter (Signed)
Pt having shoulder surgery and was answering pre-op questions. They were asked if he was ok to be intubated with his strictures. I discussed with wife that it should not be a problem but would double check with Dr. Henrene Pastor. Please advise.

## 2019-08-01 IMAGING — DX CHEST - 2 VIEW
2 series · 2 of 2 positions shown · non-contrast
Comparison: None.

CLINICAL DATA: Chest pain

EXAM:
CHEST - 2 VIEW

[w chest pa]
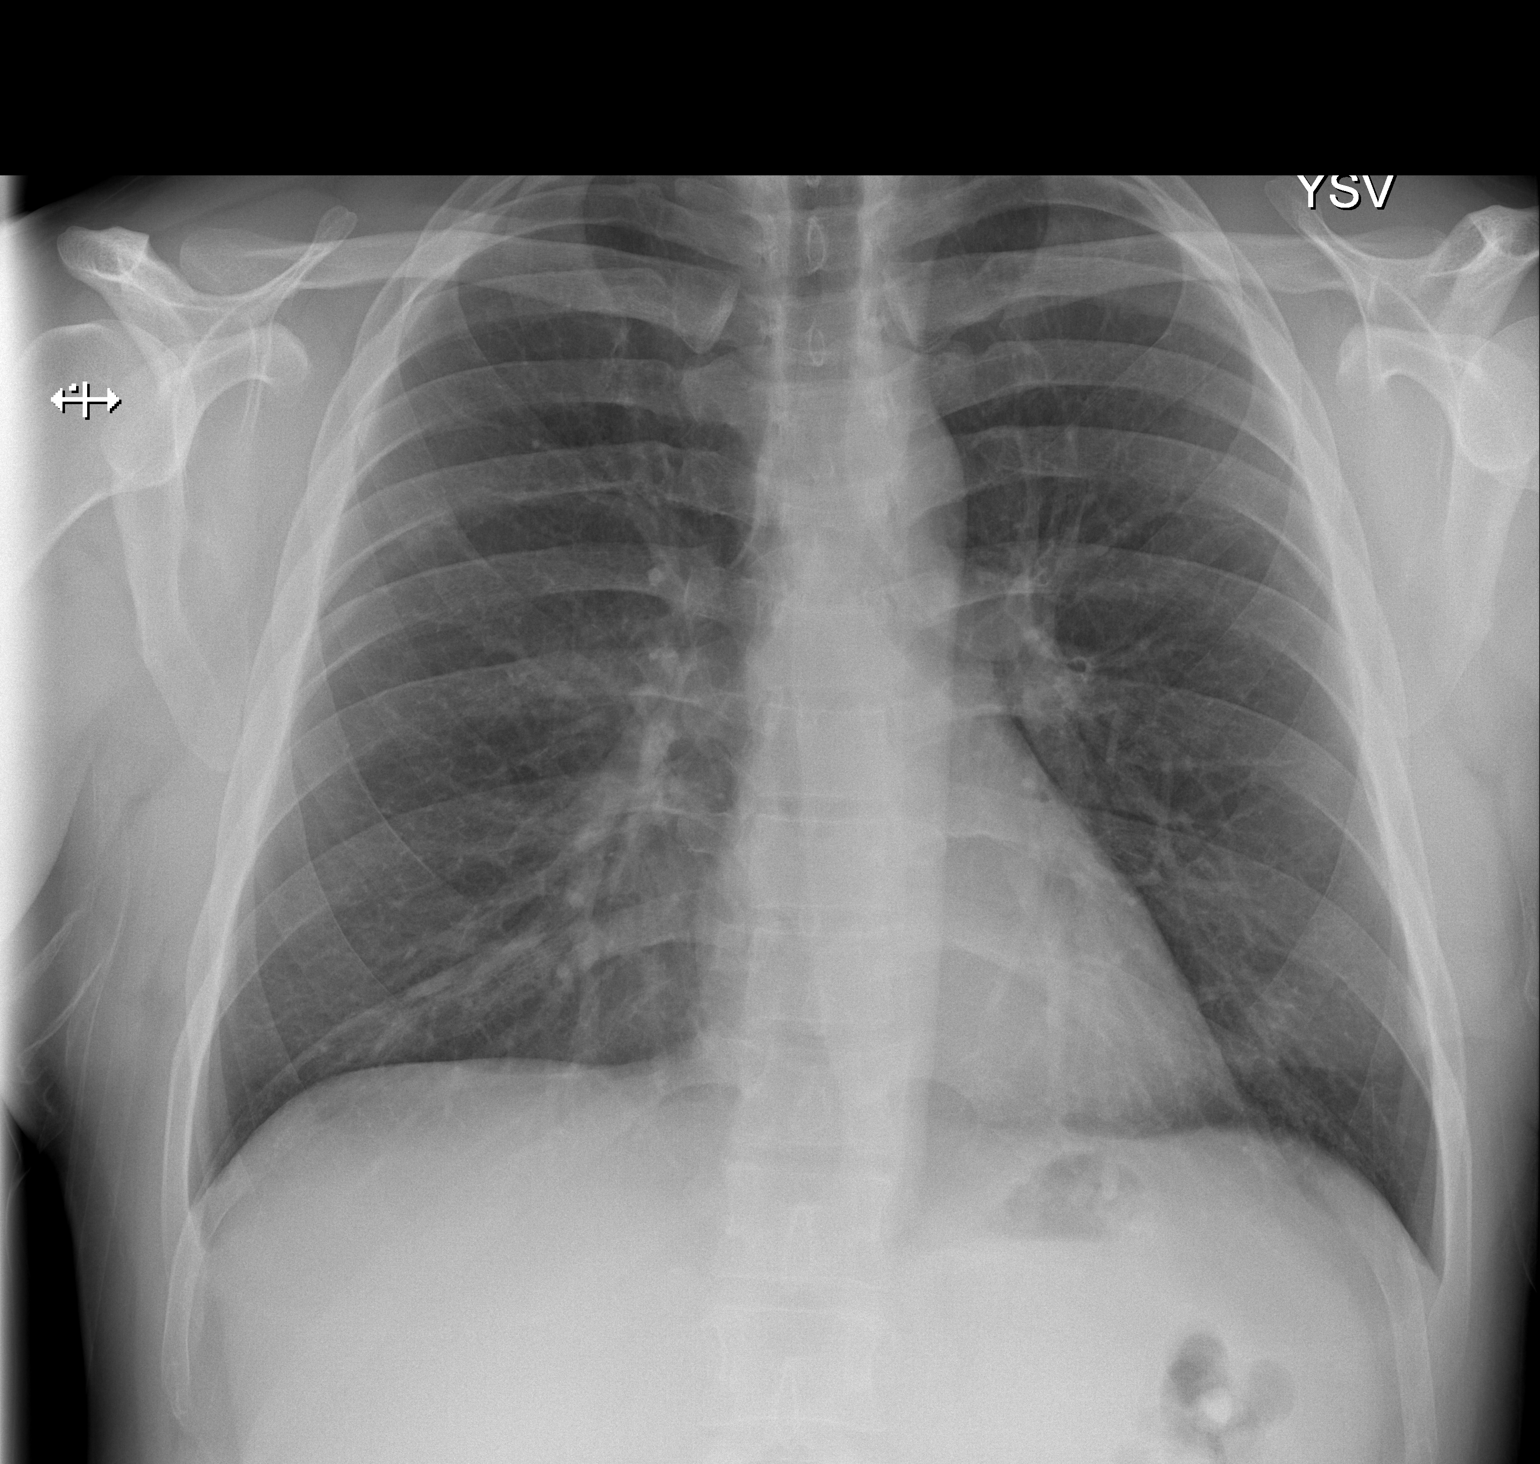

[w chest lat]
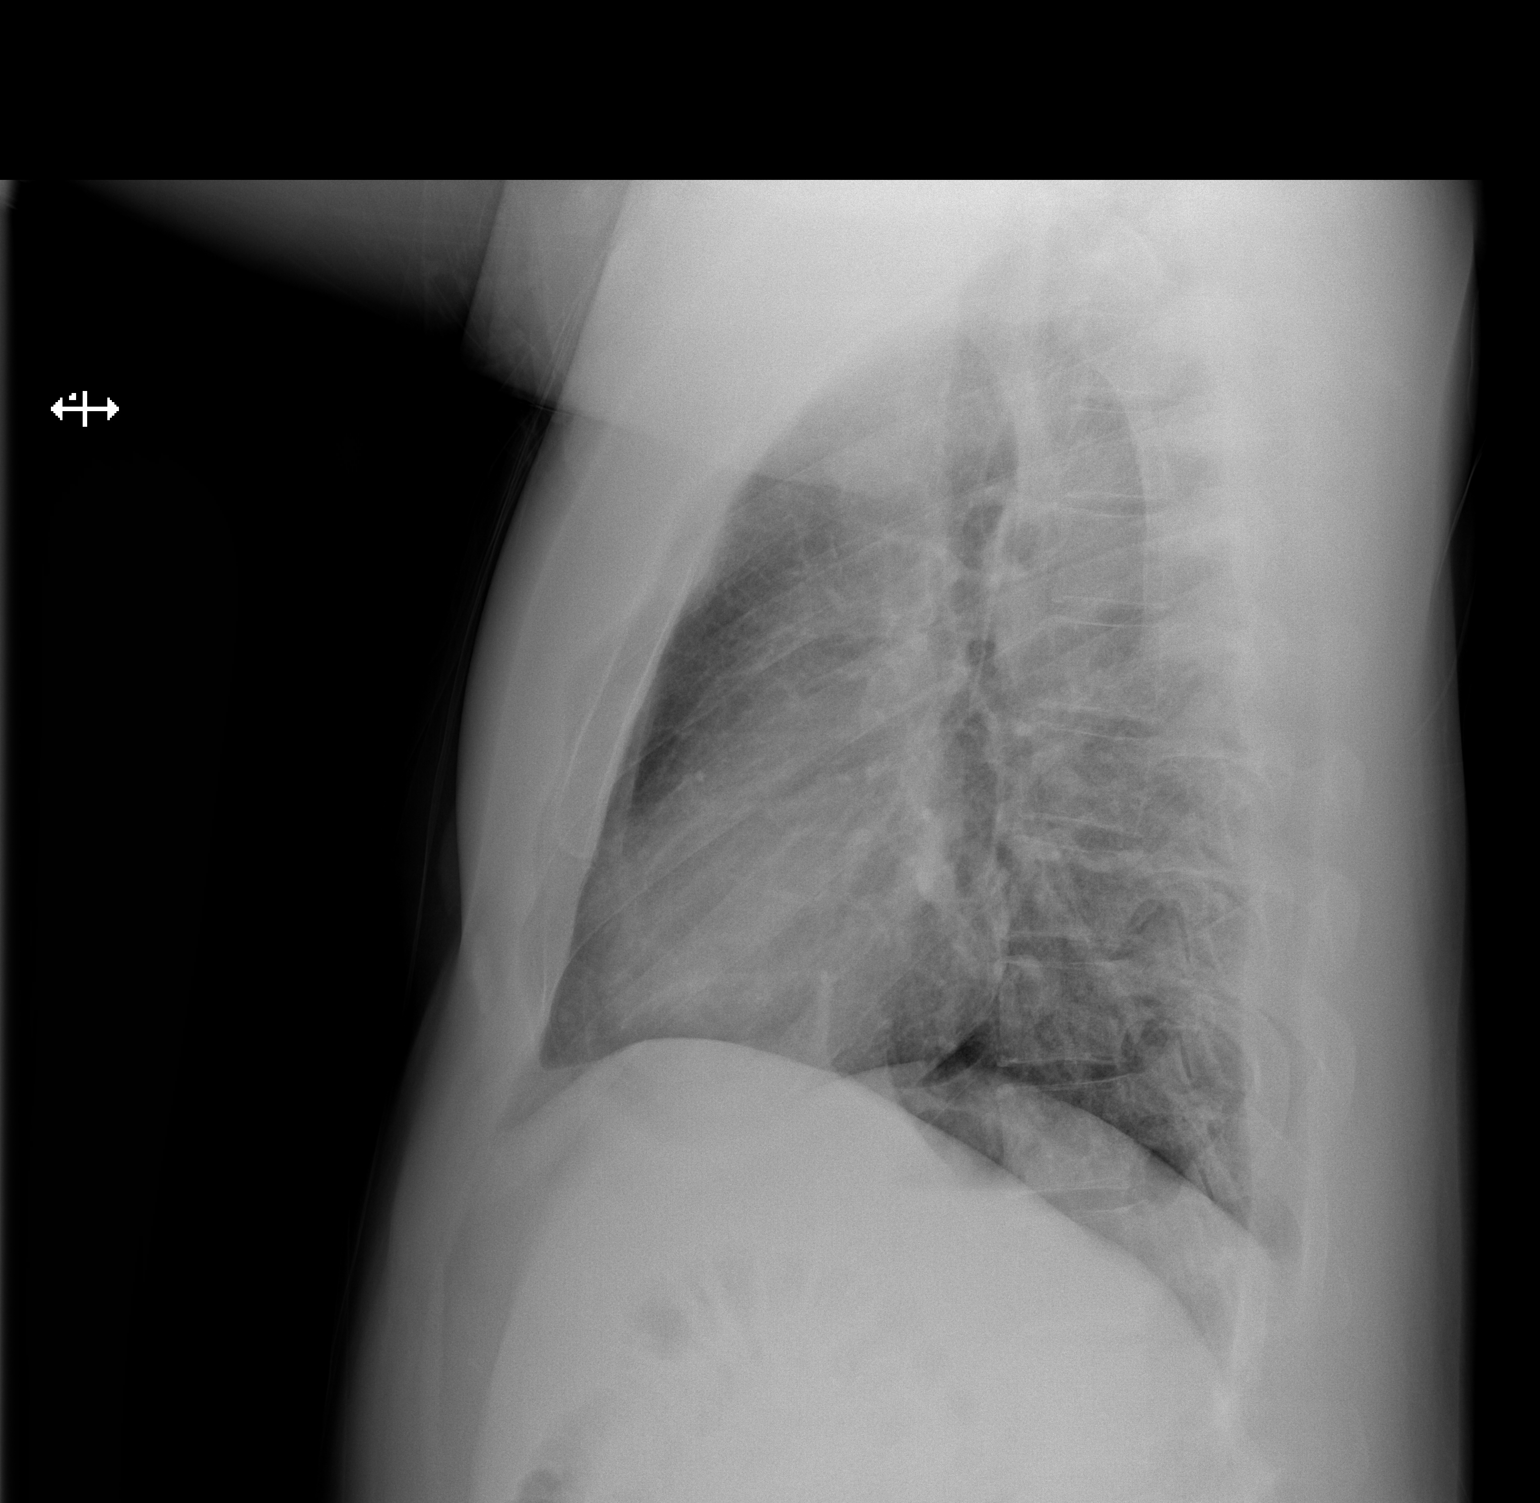

[2 of 2 positions shown; findings below may reference images not displayed]

FINDINGS: Lungs are clear. Heart size and pulmonary vascularity are normal. No
adenopathy. No pneumothorax. No bone lesions. Note that there is
thoracic lordosis.
IMPRESSION: No edema or consolidation.

## 2019-10-15 ENCOUNTER — Telehealth: Payer: Self-pay | Admitting: Internal Medicine

## 2019-10-15 DIAGNOSIS — R1310 Dysphagia, unspecified: Secondary | ICD-10-CM

## 2019-10-15 NOTE — Telephone Encounter (Signed)
Pt is scheduled for an OV 11/26/19 and requested a refill for omeprazole.

## 2019-10-16 MED ORDER — OMEPRAZOLE 40 MG PO CPDR: 40.0000 mg | DELAYED_RELEASE_CAPSULE | Freq: Two times a day (BID) | ORAL | 0 refills | Status: DC

## 2019-10-16 NOTE — Telephone Encounter (Signed)
Omeprazole refilled.

## 2019-11-26 ENCOUNTER — Encounter: Payer: Self-pay | Admitting: Internal Medicine

## 2019-11-26 ENCOUNTER — Ambulatory Visit: Payer: BC Managed Care – PPO | Admitting: Internal Medicine

## 2019-11-26 VITALS — BP 128/90 | HR 76 | Temp 98.7°F | Ht 71.0 in | Wt 209.0 lb

## 2019-11-26 DIAGNOSIS — K2100 Gastro-esophageal reflux disease with esophagitis, without bleeding: Secondary | ICD-10-CM

## 2019-11-26 DIAGNOSIS — R1310 Dysphagia, unspecified: Secondary | ICD-10-CM

## 2019-11-26 DIAGNOSIS — K222 Esophageal obstruction: Secondary | ICD-10-CM

## 2019-11-26 MED ORDER — OMEPRAZOLE 40 MG PO CPDR: 40.0000 mg | DELAYED_RELEASE_CAPSULE | Freq: Two times a day (BID) | ORAL | 3 refills | Status: DC

## 2019-11-26 NOTE — Patient Instructions (Signed)
We have sent the following medications to your pharmacy for you to pick up at your convenience:  Omeprazole.  Please follow up in one year  

## 2019-11-26 NOTE — Progress Notes (Signed)
HISTORY OF PRESENT ILLNESS:  Kristopher Hutchinson is a 29 y.o. male who was initially evaluated June 26, 2017 regarding intermittent solid food dysphagia and diarrhea.  Complete colonoscopy with biopsies along with intubation of the terminal ileum were performed January 2019.  Examination was normal including biopsies.  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  He is also undergone several upper endoscopies with esophageal dilation for ringed esophagus consistent with esophageal eosinophilia.  Associated acid peptic reflux symptoms.  He last underwent upper endoscopy June 26, 2018.  Dilated to a maximal diameter 52 [place].  He has continued on omeprazole 40 mg twice daily since.  He was to follow-up in 6 months but follows up at this time.  He is delighted to tell me that he has had [place] no reflux symptoms or dysphagia on medical therapy.  He did forget his medication while traveling and had severe reflux symptoms about 3 days into his trip.  Otherwise doing well.  Issues with irregular bowel habits have also improved  REVIEW OF SYSTEMS:  All non-GI ROS negative unless otherwise stated in the HPI except for sore shoulder Past Medical History:  Diagnosis Date  . Anxiety   . Belching   . Dysphagia    had endo in 09/13/17  . GERD (gastroesophageal reflux disease)     Past Surgical History:  Procedure Laterality Date  . COLONOSCOPY    . SHOULDER ARTHROSCOPY    . UPPER GASTROINTESTINAL ENDOSCOPY    . WISDOM TOOTH EXTRACTION  2016    Social History Kristopher Hutchinson  reports that he has never smoked. His smokeless tobacco use includes snuff. He reports current alcohol use. He reports that he does not use drugs.  family history includes Heart disease in his maternal grandfather and paternal grandfather; Hypertension in his father and mother.  Allergies  Allergen Reactions  . Penicillins Anaphylaxis       PHYSICAL EXAMINATION: Vital signs: BP 128/90   Pulse 76   Temp 98.7 F (37.1 C)   Ht 5\' 11"   (1.803 m)   Wt 209 lb (94.8 kg)   BMI 29.15 kg/m   Constitutional: generally well-appearing, no acute distress Psychiatric: alert and oriented x3, cooperative Eyes: extraocular movements intact, anicteric, conjunctiva pink Mouth: oral pharynx moist, no lesions Neck: supple no lymphadenopathy Cardiovascular: heart regular rate and rhythm, no murmur Lungs: clear to auscultation bilaterally Abdomen: soft, nontender, nondistended, no obvious ascites, no peritoneal signs, normal bowel sounds, no organomegaly Rectal: Omitted Extremities: no lower extremity edema bilaterally Skin: no lesions on visible extremities Neuro: No focal deficits.  Cranial nerves intact  ASSESSMENT:  1.  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  Asymptomatic post dilation on PPI.  Last EGD November 2019 2.  History of postinfectious IBS type picture.  Resolved.  Negative colonoscopy as described   PLAN:  1.  Reflux precautions 2.  Continue omeprazole 40 mg twice daily.  Prescription refilled.  Medication risks reviewed 3.  Routine office follow-up 1 year.  Contact the office in the interim for any questions or problems. A total time of 30 minutes was spent preparing to see the patient, reviewing test, obtaining and reviewing history, performing medically appropriate physical exam, counseling the patient regarding his chronic reflux disease complicated by stricturing, ordering his medications and reviewing the risk, and documenting clinical information in the health record

## 2020-08-31 ENCOUNTER — Ambulatory Visit: Admit: 2020-08-31 | Discharge status: Absent without leave | Payer: BC Managed Care – PPO

## 2020-09-21 ENCOUNTER — Other Ambulatory Visit: Payer: Self-pay | Admitting: Internal Medicine

## 2020-09-21 DIAGNOSIS — R1310 Dysphagia, unspecified: Secondary | ICD-10-CM

## 2020-10-14 ENCOUNTER — Other Ambulatory Visit: Payer: Self-pay | Admitting: Internal Medicine

## 2020-10-14 DIAGNOSIS — R1310 Dysphagia, unspecified: Secondary | ICD-10-CM

## 2020-10-14 NOTE — Telephone Encounter (Signed)
Wife calling to follow up on previous refill request for twice a day Omeprazole

## 2020-10-16 MED ORDER — OMEPRAZOLE 40 MG PO CPDR: DELAYED_RELEASE_CAPSULE | ORAL | 3 refills | Status: DC

## 2020-10-16 NOTE — Addendum Note (Signed)
Addended by: Jeanine Luz on: 10/16/2020 12:16 PM   Modules accepted: Orders

## 2020-10-16 NOTE — Telephone Encounter (Signed)
Submitted PA , awaiting reponse

## 2021-11-21 ENCOUNTER — Other Ambulatory Visit: Payer: Self-pay | Admitting: Internal Medicine

## 2021-11-21 DIAGNOSIS — R1310 Dysphagia, unspecified: Secondary | ICD-10-CM

## 2021-11-22 ENCOUNTER — Other Ambulatory Visit: Payer: Self-pay | Admitting: Internal Medicine

## 2021-11-22 DIAGNOSIS — R1310 Dysphagia, unspecified: Secondary | ICD-10-CM

## 2021-11-23 NOTE — Telephone Encounter (Signed)
PA sent to cover my meds for twice a day Omeprazole ?

## 2022-09-07 ENCOUNTER — Other Ambulatory Visit: Payer: Self-pay | Admitting: Internal Medicine

## 2022-09-07 DIAGNOSIS — R1310 Dysphagia, unspecified: Secondary | ICD-10-CM

## 2022-09-13 ENCOUNTER — Telehealth: Payer: Self-pay | Admitting: Internal Medicine

## 2022-09-13 DIAGNOSIS — R1310 Dysphagia, unspecified: Secondary | ICD-10-CM

## 2022-09-13 MED ORDER — OMEPRAZOLE 40 MG PO CPDR: 40.0000 mg | DELAYED_RELEASE_CAPSULE | Freq: Two times a day (BID) | ORAL | 0 refills | Status: DC

## 2022-09-13 NOTE — Telephone Encounter (Signed)
Refilled Omeprazole 

## 2022-09-13 NOTE — Telephone Encounter (Signed)
Inbound call from patients wife stating that patient needs a refill for Prilosec. Patient was scheduled to see Dr. Henrene Pastor on 2/26 at 2:00 and is requesting refill to be sent to [facility] in [place]. Please advise.

## 2022-10-17 ENCOUNTER — Ambulatory Visit: Payer: BC Managed Care – PPO | Admitting: Internal Medicine

## 2023-01-09 ENCOUNTER — Encounter: Payer: Self-pay | Admitting: Internal Medicine

## 2023-01-09 ENCOUNTER — Ambulatory Visit: Payer: BC Managed Care – PPO | Admitting: Internal Medicine

## 2023-01-09 VITALS — BP 138/80 | Ht 69.0 in | Wt 236.0 lb

## 2023-01-09 DIAGNOSIS — K2100 Gastro-esophageal reflux disease with esophagitis, without bleeding: Secondary | ICD-10-CM

## 2023-01-09 DIAGNOSIS — K222 Esophageal obstruction: Secondary | ICD-10-CM

## 2023-01-09 DIAGNOSIS — R1310 Dysphagia, unspecified: Secondary | ICD-10-CM

## 2023-01-09 MED ORDER — OMEPRAZOLE 40 MG PO CPDR: DELAYED_RELEASE_CAPSULE | ORAL | 3 refills | Status: DC

## 2023-01-09 NOTE — Progress Notes (Signed)
HISTORY OF PRESENT ILLNESS:  Kristopher Hutchinson is a 32 y.o. male with history of GERD with associated secondary esophageal eosinophilia and ringed esophagus stricturing and significant dysphagia for which she has undergone prior upper endoscopy with esophageal dilation.  Last seen in the office April 2021.  He presents today for follow-up and request medication refill.  His last upper endoscopy with Kristopher Hutchinson dilation to 20 [place] was November 2019.  Since that time he has been on omeprazole 40 mg p.o. twice daily most days.  On that regimen his reflux symptoms are controlled.  He is delighted that he has had no recurrence of his dysphagia after dilation.  No new complaints.  He does question medication refill.  He does have questions regarding sleep apnea, snoring at night, and large tonsils  REVIEW OF SYSTEMS:  All non-GI ROS negative. Past Medical History:  Diagnosis Date   Anxiety    Belching    Dysphagia    had endo in 09/13/17   GERD (gastroesophageal reflux disease)     Past Surgical History:  Procedure Laterality Date   COLONOSCOPY     SHOULDER ARTHROSCOPY     UPPER GASTROINTESTINAL ENDOSCOPY     WISDOM TOOTH EXTRACTION  2016    Social History Kristopher Hutchinson  reports that he has never smoked. His smokeless tobacco use includes snuff. He reports current alcohol use. He reports that he does not use drugs.  family history includes Heart disease in his maternal grandfather and paternal grandfather; Hypertension in his father and mother.  Allergies  Allergen Reactions   Penicillins Anaphylaxis       PHYSICAL EXAMINATION: Vital signs: BP 138/80   Ht 5\' 9"  (1.753 m)   Wt 236 lb (107 kg)   BMI 34.85 kg/m   Constitutional: generally well-appearing, no acute distress Psychiatric: alert and oriented x3, cooperative Eyes: extraocular movements intact, anicteric, conjunctiva pink Mouth: oral pharynx moist, no lesions.  Prominent tonsils Neck: supple no  lymphadenopathy Cardiovascular: heart regular rate and rhythm, no murmur Lungs: clear to auscultation bilaterally Abdomen: soft, nontender, nondistended, no obvious ascites, no peritoneal signs, normal bowel sounds, no organomegaly Rectal: Omitted Extremities: no clubbing, cyanosis, or lower extremity edema bilaterally Skin: no lesions on visible extremities Neuro: No focal deficits.  Cranial nerves intact  ASSESSMENT:  1.  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  Asymptomatic post dilation on PPI 2.  Sleep apnea with prominent tonsils   PLAN:  1.  Reflux precautions 2.  Continue omeprazole 40 mg twice daily.  Medication refilled.  Medication with reviewed. 3.  Recommended ENT prominent tonsils and pulmonary sleep medicine regarding sleep apnea. 4.  Routine GI office follow-up 1 year

## 2023-01-09 NOTE — Patient Instructions (Signed)
We have sent the following medications to your pharmacy for you to pick up at your convenience: omeprazole.   The [facility] providers would like to encourage you to use [facility] to communicate with providers for non-urgent requests or questions.  Due to long hold times on the telephone, sending your provider a message by [facility] may be a faster and more efficient way to get a response.  Please allow 48 business hours for a response.  Please remember that this is for non-urgent requests.   Thank you for choosing me and [facility].  Yancey Flemings, MD

## 2024-02-15 ENCOUNTER — Other Ambulatory Visit: Payer: Self-pay | Admitting: Internal Medicine

## 2024-02-15 ENCOUNTER — Telehealth: Payer: Self-pay | Admitting: Internal Medicine

## 2024-02-15 DIAGNOSIS — R1310 Dysphagia, unspecified: Secondary | ICD-10-CM

## 2024-02-15 MED ORDER — OMEPRAZOLE 40 MG PO CPDR: DELAYED_RELEASE_CAPSULE | ORAL | 0 refills | Status: DC

## 2024-02-15 NOTE — Telephone Encounter (Signed)
 Requesting medication refill for omeprazole. Please advise.   Thank you

## 2024-02-16 ENCOUNTER — Other Ambulatory Visit (HOSPITAL_COMMUNITY): Payer: Self-pay

## 2024-02-19 ENCOUNTER — Other Ambulatory Visit (HOSPITAL_COMMUNITY): Payer: Self-pay

## 2024-02-19 NOTE — Telephone Encounter (Signed)
 PA submitted;  once it is approved I will send it to [facility]

## 2024-02-19 NOTE — Telephone Encounter (Signed)
 Requesting omeprazole  refill be sent into [facility] in [place]. Please advise.

## 2024-02-19 NOTE — Telephone Encounter (Signed)
 PA request has been Submitted. New Encounter has been or will be created for follow up. For additional info see Pharmacy Prior Auth telephone encounter from 02-19-2024.

## 2024-02-20 ENCOUNTER — Telehealth: Payer: Self-pay

## 2024-02-20 NOTE — Telephone Encounter (Signed)
 Pharmacy Patient Advocate Encounter   Received notification from RX Request Messages that prior authorization for Omeprazole  40MG  dr capsules is required/requested.   Insurance verification completed.   The patient is insured through [facility] [employer] .   Prior Authorization for Omeprazole  40MG  dr capsules has been APPROVED from 02-19-2024 to 02-18-2025   PA #/Case ID/Reference #: [id number]

## 2024-02-22 MED ORDER — OMEPRAZOLE 40 MG PO CPDR: DELAYED_RELEASE_CAPSULE | ORAL | 0 refills | Status: AC

## 2024-02-22 NOTE — Telephone Encounter (Signed)
 PA for omeprazole  has been approved. (See other TE).  Refills sent to [facility].  Patient last seen 12-2022. Will need an appointment for further refills.

## 2024-02-22 NOTE — Addendum Note (Signed)
 Addended by: Galena Logie M on: 02/22/2024 04:36 PM   Modules accepted: Orders
